# Patient Record
Sex: Male | Born: 1965
Health system: Southern US, Community
[De-identification: ages and names within clinical notes are randomized; demographics above are authoritative.]

## PROBLEM LIST (undated history)

## (undated) DIAGNOSIS — E781 Pure hyperglyceridemia: Secondary | ICD-10-CM

## (undated) DIAGNOSIS — E119 Type 2 diabetes mellitus without complications: Secondary | ICD-10-CM

## (undated) DIAGNOSIS — I10 Essential (primary) hypertension: Secondary | ICD-10-CM

## (undated) HISTORY — PX: NO PAST SURGERIES: SHX2092

## (undated) HISTORY — DX: Pure hyperglyceridemia: E78.1

---

## 2013-01-27 ENCOUNTER — Telehealth: Payer: Self-pay | Admitting: Family Medicine

## 2013-01-27 DIAGNOSIS — E782 Mixed hyperlipidemia: Secondary | ICD-10-CM

## 2013-01-27 DIAGNOSIS — Z125 Encounter for screening for malignant neoplasm of prostate: Secondary | ICD-10-CM

## 2013-01-27 DIAGNOSIS — Z79899 Other long term (current) drug therapy: Secondary | ICD-10-CM

## 2013-01-27 NOTE — Telephone Encounter (Signed)
Blood work papers printed and left up front for patient per doctors orders. Patient notified.

## 2013-01-27 NOTE — Telephone Encounter (Signed)
Lip, liv, met-7 and psa

## 2013-01-27 NOTE — Telephone Encounter (Signed)
Pt request BW papers please to be done this week on 01/29/13

## 2013-01-30 ENCOUNTER — Encounter: Payer: Self-pay | Admitting: *Deleted

## 2013-02-03 ENCOUNTER — Other Ambulatory Visit: Payer: Self-pay | Admitting: Family Medicine

## 2013-02-03 LAB — HEPATIC FUNCTION PANEL
ALT: 38 U/L (ref 0–53)
Bilirubin, Direct: 0.1 mg/dL (ref 0.0–0.3)
Indirect Bilirubin: 0.4 mg/dL (ref 0.0–0.9)
Total Protein: 7.8 g/dL (ref 6.0–8.3)

## 2013-02-03 LAB — BASIC METABOLIC PANEL
BUN: 12 mg/dL (ref 6–23)
Chloride: 103 mEq/L (ref 96–112)
Glucose, Bld: 247 mg/dL — ABNORMAL HIGH (ref 70–99)
Potassium: 4.7 mEq/L (ref 3.5–5.3)

## 2013-02-03 LAB — LIPID PANEL
LDL Cholesterol: 61 mg/dL (ref 0–99)
VLDL: 71 mg/dL — ABNORMAL HIGH (ref 0–40)

## 2013-02-03 LAB — PSA: PSA: 0.59 ng/mL (ref <=4.00)

## 2013-02-07 ENCOUNTER — Encounter: Payer: Self-pay | Admitting: Family Medicine

## 2013-02-07 ENCOUNTER — Ambulatory Visit (INDEPENDENT_AMBULATORY_CARE_PROVIDER_SITE_OTHER): Payer: BC Managed Care – PPO | Admitting: Family Medicine

## 2013-02-07 VITALS — BP 160/108 | HR 80 | Wt 217.0 lb

## 2013-02-07 DIAGNOSIS — I10 Essential (primary) hypertension: Secondary | ICD-10-CM

## 2013-02-07 DIAGNOSIS — E119 Type 2 diabetes mellitus without complications: Secondary | ICD-10-CM

## 2013-02-07 DIAGNOSIS — E785 Hyperlipidemia, unspecified: Secondary | ICD-10-CM

## 2013-02-07 LAB — POCT GLYCOSYLATED HEMOGLOBIN (HGB A1C): Hemoglobin A1C: 11.1

## 2013-02-07 MED ORDER — METFORMIN HCL 500 MG PO TABS: ORAL_TABLET | ORAL | Status: DC

## 2013-02-07 MED ORDER — FENOFIBRATE 160 MG PO TABS: 160.0000 mg | ORAL_TABLET | Freq: Every day | ORAL | Status: DC

## 2013-02-07 MED ORDER — GLYBURIDE 5 MG PO TABS: ORAL_TABLET | ORAL | Status: DC

## 2013-02-07 MED ORDER — ATORVASTATIN CALCIUM 10 MG PO TABS: 10.0000 mg | ORAL_TABLET | Freq: Every day | ORAL | Status: DC

## 2013-02-07 NOTE — Patient Instructions (Signed)
Increase glyburide to two tabs twice per day. Check sugar once each morning, record, and bring results next visit.

## 2013-02-07 NOTE — Progress Notes (Signed)
  Subjective:    Patient ID: Corey [name], male    DOB: 08-06-1966, 47 y.o.   MRN: [id number]  Diabetes He has type 2 diabetes mellitus. His disease course has been worsening. Symptoms are worsening. Current diabetic treatment includes diet. His weight is increasing steadily. He is following a diabetic, generally unhealthy, low salt and low fat/cholesterol diet. Meal planning includes avoidance of concentrated sweets. He has not had a previous visit with a dietician. He rarely participates in exercise. His breakfast blood glucose range is generally 180-200 mg/dl. An ACE inhibitor/angiotensin II receptor blocker is not being taken.   Stopped [place] six wks ago Results for orders placed in visit on 02/07/13  POCT GLYCOSYLATED HEMOGLOBIN (HGB A1C)      Result Value Range   Hemoglobin A1C 11.1     As far as his hyperlipidemia, patient states he does not always take his meds. He watches his diet only so-so. Not exercising at all.  With hypertension, patient often has excessive salt. In addition, not exercising. In addition, not always taking his medicine.  Review of Systems  All other systems reviewed and are negative.       Objective:   Physical Exam  Alert no acute distress. HEENT normal. Vitals reviewed. Blood pressure improved on repeat 146/92. Alert. Lungs clear. Heart regular in rhythm. Feet pulses good. Sensation good. No edema.      Assessment & Plan:  Impression #1 type 2 diabetes. Control very poor. 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 Did not want to take Januvia or so stopped Complicated by a #2. #2 profound noncompliance. #3 hyperlipidemia controlled suboptimum-discussed. Complicated by #2. #4 hypertension controlled suboptimum. Discussed. Complicated by #2. Plan patient simply asked to take his medicine. Stopped Januvia. Increase glyburide to 2 twice a day. Rationale discussed. Recheck recheck in 3 months. Diet exercise discussed. Easily 25 minutes spent  most in discussion. WSL

## 2013-02-09 DIAGNOSIS — E782 Mixed hyperlipidemia: Secondary | ICD-10-CM | POA: Insufficient documentation

## 2013-02-09 DIAGNOSIS — E785 Hyperlipidemia, unspecified: Secondary | ICD-10-CM | POA: Insufficient documentation

## 2013-02-09 DIAGNOSIS — I10 Essential (primary) hypertension: Secondary | ICD-10-CM | POA: Insufficient documentation

## 2013-02-09 DIAGNOSIS — E119 Type 2 diabetes mellitus without complications: Secondary | ICD-10-CM | POA: Insufficient documentation

## 2013-04-22 ENCOUNTER — Telehealth: Payer: Self-pay | Admitting: Family Medicine

## 2013-04-22 NOTE — Telephone Encounter (Signed)
Pt needs Rx sent to [facility] for test strips.  He is currently using One Touch Ultra Mini Please call pt when done.

## 2013-04-22 NOTE — Telephone Encounter (Signed)
Prescription faxed to [facility] for test strips.

## 2013-05-16 ENCOUNTER — Telehealth: Payer: Self-pay | Admitting: Family Medicine

## 2013-05-16 DIAGNOSIS — E785 Hyperlipidemia, unspecified: Secondary | ICD-10-CM

## 2013-05-16 DIAGNOSIS — Z125 Encounter for screening for malignant neoplasm of prostate: Secondary | ICD-10-CM

## 2013-05-16 DIAGNOSIS — Z79899 Other long term (current) drug therapy: Secondary | ICD-10-CM

## 2013-05-16 NOTE — Telephone Encounter (Signed)
Pt would like BW papers for appt

## 2013-05-17 ENCOUNTER — Other Ambulatory Visit: Payer: Self-pay | Admitting: Family Medicine

## 2013-05-18 LAB — HEPATIC FUNCTION PANEL
ALT: 32 U/L (ref 0–53)
AST: 21 U/L (ref 0–37)
Albumin: 4.5 g/dL (ref 3.5–5.2)
Alkaline Phosphatase: 29 U/L — ABNORMAL LOW (ref 39–117)
Total Bilirubin: 0.5 mg/dL (ref 0.3–1.2)
Total Protein: 7.9 g/dL (ref 6.0–8.3)

## 2013-05-18 LAB — BASIC METABOLIC PANEL
CO2: 26 mEq/L (ref 19–32)
Calcium: 10 mg/dL (ref 8.4–10.5)
Creat: 1.08 mg/dL (ref 0.50–1.35)
Sodium: 138 mEq/L (ref 135–145)

## 2013-05-18 LAB — LIPID PANEL
Cholesterol: 115 mg/dL (ref 0–200)
HDL: 17 mg/dL — ABNORMAL LOW (ref >39)
Total CHOL/HDL Ratio: 6.8 Ratio
Triglycerides: 392 mg/dL — ABNORMAL HIGH (ref <150)

## 2013-05-18 LAB — PSA: PSA: 0.49 ng/mL (ref <=4.00)

## 2013-05-22 ENCOUNTER — Other Ambulatory Visit: Payer: Self-pay | Admitting: *Deleted

## 2013-05-22 NOTE — Telephone Encounter (Signed)
Lip liv met7 psa. We'll do A1C here since visit tomorrow. Let him know we understand if he can't do these before visit

## 2013-05-22 NOTE — Telephone Encounter (Signed)
bw paper ready but pt states he did bw on sat. Papers were given on 05/16/13 also.

## 2013-05-22 NOTE — Addendum Note (Signed)
Addended by: Metro Kung on: 05/22/2013 09:08 AM   Modules accepted: Orders

## 2013-05-23 ENCOUNTER — Telehealth: Payer: Self-pay | Admitting: Family Medicine

## 2013-05-23 ENCOUNTER — Ambulatory Visit (INDEPENDENT_AMBULATORY_CARE_PROVIDER_SITE_OTHER): Payer: BC Managed Care – PPO | Admitting: Family Medicine

## 2013-05-23 ENCOUNTER — Encounter: Payer: Self-pay | Admitting: Family Medicine

## 2013-05-23 VITALS — BP 138/98 | HR 80 | Ht 67.0 in | Wt 216.0 lb

## 2013-05-23 DIAGNOSIS — E119 Type 2 diabetes mellitus without complications: Secondary | ICD-10-CM

## 2013-05-23 LAB — GLUCOSE, POCT (MANUAL RESULT ENTRY): POC Glucose: 251 mg/dl — AB (ref 70–99)

## 2013-05-23 LAB — POCT GLYCOSYLATED HEMOGLOBIN (HGB A1C): Hemoglobin A1C: >13

## 2013-05-23 NOTE — Telephone Encounter (Signed)
Notified. 

## 2013-05-23 NOTE — Telephone Encounter (Signed)
Yes, i told pt he'd be hearing from referral person

## 2013-05-23 NOTE — Telephone Encounter (Signed)
stve to see

## 2013-05-23 NOTE — Progress Notes (Signed)
  Subjective:    Patient ID: Corey [name], male    DOB: 14-Jul-1966, 47 y.o.   MRN: [id number]  Diabetes He presents for his follow-up diabetic visit. He has type 2 diabetes mellitus. There are no hypoglycemic associated symptoms. Pertinent negatives for diabetes include no chest pain and no fatigue. Symptoms are worsening. When asked about current treatments, none were reported.    Results for orders placed in visit on 05/23/13  POCT GLYCOSYLATED HEMOGLOBIN (HGB A1C)      Result Value Range   Hemoglobin A1C >13    GLUCOSE, POCT (MANUAL RESULT ENTRY)      Result Value Range   POC Glucose 251 (*) 70 - 99 mg/dl   watch diet best he can. Though he admits to poor dietary compliance and working.  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 His sugars continue to worsen. He is not exercising.   Review of Systems  Constitutional: Negative for fatigue.  Cardiovascular: Negative for chest pain.   Otherwise negative    Objective:   Physical Exam  Alert no acute distress. HEENT normal. Lungs clear. Heart regular rate and rhythm. Ankles without edema. Sensation intact pulses good      Assessment & Plan:  Impression #1 type 2 diabetes control is extremely poor. #2 hypertension decent control. #3 hyperlipidemia discussed. Plan diet exercise discussed. Blood work discussed. Consultation with diabetes specialist rationale discussed. WSL

## 2013-05-23 NOTE — Telephone Encounter (Signed)
Patient said you wanted him to see Dr Fransico Him and is wondering if he will need a referral?

## 2013-09-04 ENCOUNTER — Other Ambulatory Visit: Payer: Self-pay | Admitting: Family Medicine

## 2013-09-28 ENCOUNTER — Other Ambulatory Visit: Payer: Self-pay | Admitting: Family Medicine

## 2013-10-12 ENCOUNTER — Other Ambulatory Visit: Payer: Self-pay | Admitting: Family Medicine

## 2013-10-29 ENCOUNTER — Other Ambulatory Visit: Payer: Self-pay | Admitting: Family Medicine

## 2013-11-03 ENCOUNTER — Telehealth: Payer: Self-pay | Admitting: Family Medicine

## 2013-11-03 DIAGNOSIS — E119 Type 2 diabetes mellitus without complications: Secondary | ICD-10-CM

## 2013-11-03 DIAGNOSIS — E782 Mixed hyperlipidemia: Secondary | ICD-10-CM

## 2013-11-03 DIAGNOSIS — Z79899 Other long term (current) drug therapy: Secondary | ICD-10-CM

## 2013-11-03 NOTE — Telephone Encounter (Signed)
Patient needs BW ordered

## 2013-11-07 ENCOUNTER — Other Ambulatory Visit: Payer: Self-pay | Admitting: Family Medicine

## 2013-11-11 NOTE — Telephone Encounter (Signed)
Lip liv A1c plus ov

## 2013-11-11 NOTE — Telephone Encounter (Signed)
Blood work orders placed in Epic. Patient notified. 

## 2013-11-21 LAB — HEMOGLOBIN A1C
Hgb A1c MFr Bld: 8.6 % — ABNORMAL HIGH (ref <5.7)
MEAN PLASMA GLUCOSE: 200 mg/dL — AB (ref <117)

## 2013-11-21 LAB — LIPID PANEL
CHOLESTEROL: 125 mg/dL (ref 0–200)
HDL: 23 mg/dL — ABNORMAL LOW (ref >39)
LDL Cholesterol: 44 mg/dL (ref 0–99)
TRIGLYCERIDES: 292 mg/dL — AB (ref <150)
Total CHOL/HDL Ratio: 5.4 Ratio
VLDL: 58 mg/dL — ABNORMAL HIGH (ref 0–40)

## 2013-11-21 LAB — HEPATIC FUNCTION PANEL
ALT: 45 U/L (ref 0–53)
AST: 26 U/L (ref 0–37)
Albumin: 4.4 g/dL (ref 3.5–5.2)
Alkaline Phosphatase: 28 U/L — ABNORMAL LOW (ref 39–117)
BILIRUBIN INDIRECT: 0.4 mg/dL (ref 0.2–1.2)
Bilirubin, Direct: 0.1 mg/dL (ref 0.0–0.3)
TOTAL PROTEIN: 7.3 g/dL (ref 6.0–8.3)
Total Bilirubin: 0.5 mg/dL (ref 0.2–1.2)

## 2013-11-28 ENCOUNTER — Ambulatory Visit (INDEPENDENT_AMBULATORY_CARE_PROVIDER_SITE_OTHER): Payer: BC Managed Care – PPO | Admitting: Family Medicine

## 2013-11-28 ENCOUNTER — Encounter: Payer: Self-pay | Admitting: Family Medicine

## 2013-11-28 VITALS — BP 150/90 | Ht 67.25 in | Wt 217.1 lb

## 2013-11-28 DIAGNOSIS — E119 Type 2 diabetes mellitus without complications: Secondary | ICD-10-CM

## 2013-11-28 DIAGNOSIS — E785 Hyperlipidemia, unspecified: Secondary | ICD-10-CM

## 2013-11-28 DIAGNOSIS — I10 Essential (primary) hypertension: Secondary | ICD-10-CM

## 2013-11-28 DIAGNOSIS — Z0000 Encounter for general adult medical examination without abnormal findings: Secondary | ICD-10-CM

## 2013-11-28 MED ORDER — METFORMIN HCL 500 MG PO TABS: ORAL_TABLET | ORAL | Status: DC

## 2013-11-28 MED ORDER — FENOFIBRATE 160 MG PO TABS: ORAL_TABLET | ORAL | Status: DC

## 2013-11-28 MED ORDER — SITAGLIPTIN PHOSPHATE 100 MG PO TABS: 100.0000 mg | ORAL_TABLET | Freq: Every day | ORAL | Status: DC

## 2013-11-28 MED ORDER — ATORVASTATIN CALCIUM 10 MG PO TABS: ORAL_TABLET | ORAL | Status: DC

## 2013-11-28 MED ORDER — GLYBURIDE 5 MG PO TABS: ORAL_TABLET | ORAL | Status: DC

## 2013-11-28 NOTE — Progress Notes (Signed)
Subjective:    Patient ID: Corey [name], male    DOB: 09-05-1966, 48 y.o.   MRN: [id number]  HPI Patient states he is here today for his annual wellness exam. States he has no concerns at this time. He is doing very well.  Results for orders placed in visit on 11/03/13  LIPID PANEL      Result Value Range   Cholesterol 125  0 - 200 mg/dL   Triglycerides 119 (*) <150 mg/dL   HDL 23 (*) >14 mg/dL   Total CHOL/HDL Ratio 5.4     VLDL 58 (*) 0 - 40 mg/dL   LDL Cholesterol 44  0 - 99 mg/dL  HEPATIC FUNCTION PANEL      Result Value Range   Total Bilirubin 0.5  0.2 - 1.2 mg/dL   Bilirubin, Direct 0.1  0.0 - 0.3 mg/dL   Indirect Bilirubin 0.4  0.2 - 1.2 mg/dL   Alkaline Phosphatase 28 (*) 39 - 117 U/L   AST 26  0 - 37 U/L   ALT 45  0 - 53 U/L   Total Protein 7.3  6.0 - 8.3 g/dL   Albumin 4.4  3.5 - 5.2 g/dL  HEMOGLOBIN N8G      Result Value Range   Hemoglobin A1C 8.6 (*) <5.7 %   Mean Plasma Glucose 200 (*) <117 mg/dL   BP usually doesn't check  Exercising not so well,  On januvia, got a discount, able to get for inexpensive. Patient states he did not go to a specialist as scheduled. It would have cost him too much. Instead he has been working harder on diet.  Has not checked sugars lately but noted the numbers were improving the last time he evaluated.  Due to turn 48 this spring.   Review of Systems  Constitutional: Negative for fever, activity change and appetite change.  HENT: Negative for congestion and rhinorrhea.   Eyes: Negative for discharge.  Respiratory: Negative for cough and wheezing.   Cardiovascular: Negative for chest pain.  Gastrointestinal: Negative for vomiting, abdominal pain and blood in stool.  Genitourinary: Negative for frequency and difficulty urinating.  Musculoskeletal: Negative for neck pain.  Skin: Negative for rash.  Allergic/Immunologic: Negative for environmental allergies and food allergies.  Neurological: Negative for weakness and headaches.   Psychiatric/Behavioral: Negative for agitation.       Objective:   Physical Exam  Constitutional: He appears well-developed and well-nourished.  HENT:  Head: Normocephalic and atraumatic.  Right Ear: External ear normal.  Left Ear: External ear normal.  Nose: Nose normal.  Mouth/Throat: Oropharynx is clear and moist.  Eyes: EOM are normal. Pupils are equal, round, and reactive to light.  Neck: Normal range of motion. Neck supple. No thyromegaly present.  Cardiovascular: Normal rate, regular rhythm and normal heart sounds.   No murmur heard. Pulmonary/Chest: Effort normal and breath sounds normal. No respiratory distress. He has no wheezes.  Abdominal: Soft. Bowel sounds are normal. He exhibits no distension and no mass. There is no tenderness.  Genitourinary: Prostate normal and penis normal.  Musculoskeletal: Normal range of motion. He exhibits no edema.  Lymphadenopathy:    He has no cervical adenopathy.  Neurological: He is alert. He exhibits normal muscle tone.  Skin: Skin is warm and dry. No erythema.  Psychiatric: He has a normal mood and affect. His behavior is normal. Judgment normal.          Assessment & Plan:  Impression 1 wellness exam. #2 type 2  diabetes with noncompliance both with past medications diet exercise and even consultation with specialist. Long discussion held. For now we will maintain management for the patient. Plan colonoscopy sheet given. Diet exercise discussed. Maintain same medications. Recheck in several months. WSL

## 2013-11-30 ENCOUNTER — Other Ambulatory Visit: Payer: Self-pay | Admitting: Family Medicine

## 2013-12-31 ENCOUNTER — Encounter: Payer: Self-pay | Admitting: Family Medicine

## 2013-12-31 ENCOUNTER — Ambulatory Visit (INDEPENDENT_AMBULATORY_CARE_PROVIDER_SITE_OTHER): Payer: BC Managed Care – PPO | Admitting: Family Medicine

## 2013-12-31 VITALS — BP 136/88 | Ht 67.0 in | Wt 217.2 lb

## 2013-12-31 DIAGNOSIS — R079 Chest pain, unspecified: Secondary | ICD-10-CM

## 2013-12-31 MED ORDER — ETODOLAC 400 MG PO TABS: 400.0000 mg | ORAL_TABLET | Freq: Two times a day (BID) | ORAL | Status: DC

## 2013-12-31 NOTE — Progress Notes (Signed)
   Subjective:    Patient ID: Corey [name], male    DOB: May 23, 1966, 48 y.o.   MRN: [id number]  HPI Patient arrives with chest pain with movement and stretching for a few weeks. Having chest discomfort with certain angle of the arm  Going on for several weeks  Pain transient last a few monents  No nausea nos sweaty no sob  No noct painno pain wwith exertion  No other symptoms.  [facility]   fa cd at age 48 Review of Systems No nausea no diaphoresis no back pain no abdominal pain no change in bowel habits stool ROS otherwise    Objective:   Physical Exam  Alert no apparent distress. Lungs clear. Heart regular in rhythm. H&T normal. Abdomen benign. Chest wall nontender to palpation.  EKG normal sinus rhythm. No significant ST-T changes.      Assessment & Plan:  Impression 1 chest pain highly likely musculoskeletal discussed at great length. Hold off from cardiology referral the Voltaren twice a day with food when necessary.

## 2014-01-26 ENCOUNTER — Encounter: Payer: Self-pay | Admitting: Family Medicine

## 2014-03-02 ENCOUNTER — Telehealth: Payer: Self-pay | Admitting: Family Medicine

## 2014-03-02 DIAGNOSIS — Z13220 Encounter for screening for lipoid disorders: Secondary | ICD-10-CM

## 2014-03-02 DIAGNOSIS — E119 Type 2 diabetes mellitus without complications: Secondary | ICD-10-CM

## 2014-03-02 DIAGNOSIS — Z125 Encounter for screening for malignant neoplasm of prostate: Secondary | ICD-10-CM

## 2014-03-02 DIAGNOSIS — Z79899 Other long term (current) drug therapy: Secondary | ICD-10-CM

## 2014-03-02 NOTE — Telephone Encounter (Signed)
Blood work orders placed in Epic. Patient notified. 

## 2014-03-02 NOTE — Telephone Encounter (Signed)
Patient had Lipid, Liver and Hgb A1c on 11/21/13

## 2014-03-02 NOTE — Telephone Encounter (Signed)
Also psa m7

## 2014-03-02 NOTE — Telephone Encounter (Signed)
Lip liv A1C 

## 2014-03-02 NOTE — Addendum Note (Signed)
Addended by: Margaretha SheffieldBROWN, AUTUMN S on: 03/02/2014 04:42 PM   Modules accepted: Orders

## 2014-03-02 NOTE — Telephone Encounter (Signed)
Patient has diabetic check on 5/15 and wanting to get his labs done before he come in. Please notify when papers are sent over to [facility].

## 2014-03-03 LAB — BASIC METABOLIC PANEL
BUN: 16 mg/dL (ref 6–23)
CALCIUM: 9.9 mg/dL (ref 8.4–10.5)
CO2: 24 mEq/L (ref 19–32)
CREATININE: 1.1 mg/dL (ref 0.50–1.35)
Chloride: 103 mEq/L (ref 96–112)
Glucose, Bld: 217 mg/dL — ABNORMAL HIGH (ref 70–99)
Potassium: 4.6 mEq/L (ref 3.5–5.3)
SODIUM: 137 meq/L (ref 135–145)

## 2014-03-03 LAB — HEPATIC FUNCTION PANEL
ALT: 33 U/L (ref 0–53)
AST: 24 U/L (ref 0–37)
Albumin: 4.4 g/dL (ref 3.5–5.2)
Alkaline Phosphatase: 27 U/L — ABNORMAL LOW (ref 39–117)
BILIRUBIN TOTAL: 0.4 mg/dL (ref 0.2–1.2)
Bilirubin, Direct: 0.1 mg/dL (ref 0.0–0.3)
Indirect Bilirubin: 0.3 mg/dL (ref 0.2–1.2)
TOTAL PROTEIN: 7.7 g/dL (ref 6.0–8.3)

## 2014-03-03 LAB — LIPID PANEL
Cholesterol: 139 mg/dL (ref 0–200)
HDL: 17 mg/dL — ABNORMAL LOW (ref >39)
LDL CALC: 43 mg/dL (ref 0–99)
Total CHOL/HDL Ratio: 8.2 Ratio
Triglycerides: 395 mg/dL — ABNORMAL HIGH (ref <150)
VLDL: 79 mg/dL — ABNORMAL HIGH (ref 0–40)

## 2014-03-03 LAB — HEMOGLOBIN A1C
HEMOGLOBIN A1C: 8.8 % — AB (ref <5.7)
MEAN PLASMA GLUCOSE: 206 mg/dL — AB (ref <117)

## 2014-03-04 LAB — PSA: PSA: 0.58 ng/mL (ref <=4.00)

## 2014-03-06 ENCOUNTER — Ambulatory Visit (INDEPENDENT_AMBULATORY_CARE_PROVIDER_SITE_OTHER): Payer: BC Managed Care – PPO | Admitting: Family Medicine

## 2014-03-06 ENCOUNTER — Encounter: Payer: Self-pay | Admitting: Family Medicine

## 2014-03-06 VITALS — BP 136/85 | Ht 67.0 in | Wt 215.0 lb

## 2014-03-06 DIAGNOSIS — E785 Hyperlipidemia, unspecified: Secondary | ICD-10-CM

## 2014-03-06 DIAGNOSIS — I10 Essential (primary) hypertension: Secondary | ICD-10-CM

## 2014-03-06 DIAGNOSIS — E119 Type 2 diabetes mellitus without complications: Secondary | ICD-10-CM

## 2014-03-06 MED ORDER — FENOFIBRATE 160 MG PO TABS: ORAL_TABLET | ORAL | Status: DC

## 2014-03-06 MED ORDER — SITAGLIPTIN PHOSPHATE 100 MG PO TABS: 100.0000 mg | ORAL_TABLET | Freq: Every day | ORAL | Status: DC

## 2014-03-06 MED ORDER — ATORVASTATIN CALCIUM 10 MG PO TABS: ORAL_TABLET | ORAL | Status: DC

## 2014-03-06 MED ORDER — GLYBURIDE 5 MG PO TABS: ORAL_TABLET | ORAL | Status: DC

## 2014-03-06 MED ORDER — METFORMIN HCL 500 MG PO TABS: ORAL_TABLET | ORAL | Status: DC

## 2014-03-06 NOTE — Progress Notes (Signed)
   Subjective:    Patient ID: Corey [name], male    DOB: 27-Dec-1965, 48 y.o.   MRN: [id number]  Diabetes He presents for his follow-up diabetic visit. He has type 2 diabetes mellitus. He never participates in exercise. His breakfast blood glucose range is generally 140-180 mg/dl. He does not see a podiatrist.Eye exam is not current.  pt had blood work done a1c 8.8. No sig low sugar spells.  Compliant with liped med Pt states no concerns today  Results for orders placed in visit on 03/02/14  LIPID PANEL      Result Value Ref Range   Cholesterol 139  0 - 200 mg/dL   Triglycerides 409395 (*) <150 mg/dL   HDL 17 (*) >81>39 mg/dL   Total CHOL/HDL Ratio 8.2     VLDL 79 (*) 0 - 40 mg/dL   LDL Cholesterol 43  0 - 99 mg/dL  HEPATIC FUNCTION PANEL      Result Value Ref Range   Total Bilirubin 0.4  0.2 - 1.2 mg/dL   Bilirubin, Direct 0.1  0.0 - 0.3 mg/dL   Indirect Bilirubin 0.3  0.2 - 1.2 mg/dL   Alkaline Phosphatase 27 (*) 39 - 117 U/L   AST 24  0 - 37 U/L   ALT 33  0 - 53 U/L   Total Protein 7.7  6.0 - 8.3 g/dL   Albumin 4.4  3.5 - 5.2 g/dL  HEMOGLOBIN [id number]      Result Value Ref Range   Hemoglobin A1C 8.8 (*) <5.7 %   Mean Plasma Glucose 206 (*) <117 mg/dL  PSA      Result Value Ref Range   PSA 0.58  <=4.00 ng/mL  BASIC METABOLIC PANEL      Result Value Ref Range   Sodium 137  135 - 145 mEq/L   Potassium 4.6  3.5 - 5.3 mEq/L   Chloride 103  96 - 112 mEq/L   CO2 24  19 - 32 mEq/L   Glucose, Bld 217 (*) 70 - 99 mg/dL   BUN 16  6 - 23 mg/dL   Creat 4.781.10  2.950.50 - 6.211.35 mg/dL   Calcium 9.9  8.4 - 30.810.5 mg/dL   Exercise poor, not good per pt, just not getting around to it   Review of Systems No headache no chest pain no back pain no abdominal pain no change about habits no blood in stool no rash ROS otherwise negative    Objective:   Physical Exam  Alert mild malaise. Blood pressure eczema repeat. HEENT normal. Lungs clear. Heart rare rhythm. Ankles edema. Feet pulses good sensation  intact. No deformity.      Assessment & Plan:  Impression 1 type 2 diabetes still suboptimal in discussed at length. Patient admits to dietary and exercise noncompliance. #2 hypertension controlled by diet alone good control discomfort #3 hyperlipidemia cholesterol side is in good control. He triglycerides side not. Discussed at length. Plan diet exercise discussed. Recheck in 4 months. Numbers need to be improved. Medications refilled. WSL

## 2014-03-26 ENCOUNTER — Other Ambulatory Visit: Payer: Self-pay | Admitting: Family Medicine

## 2014-05-19 ENCOUNTER — Telehealth: Payer: Self-pay | Admitting: Family Medicine

## 2014-05-19 DIAGNOSIS — E781 Pure hyperglyceridemia: Secondary | ICD-10-CM

## 2014-05-19 DIAGNOSIS — Z79899 Other long term (current) drug therapy: Secondary | ICD-10-CM

## 2014-05-19 DIAGNOSIS — E119 Type 2 diabetes mellitus without complications: Secondary | ICD-10-CM

## 2014-05-19 NOTE — Telephone Encounter (Signed)
Lip liv A1c 

## 2014-05-19 NOTE — Telephone Encounter (Signed)
Patient notified and verbalized understanding. 

## 2014-05-19 NOTE — Telephone Encounter (Signed)
Pts needs bw orders for appt on 7/31  Last labs 03/02/14   Lipid, Hep Func, A1C, PSA, BMP

## 2014-05-22 ENCOUNTER — Encounter: Payer: Self-pay | Admitting: Family Medicine

## 2014-05-22 ENCOUNTER — Ambulatory Visit (INDEPENDENT_AMBULATORY_CARE_PROVIDER_SITE_OTHER): Payer: BC Managed Care – PPO | Admitting: Family Medicine

## 2014-05-22 VITALS — BP 142/82 | Ht 67.0 in | Wt 209.0 lb

## 2014-05-22 DIAGNOSIS — E785 Hyperlipidemia, unspecified: Secondary | ICD-10-CM

## 2014-05-22 DIAGNOSIS — E119 Type 2 diabetes mellitus without complications: Secondary | ICD-10-CM

## 2014-05-22 DIAGNOSIS — I10 Essential (primary) hypertension: Secondary | ICD-10-CM

## 2014-05-22 LAB — HEPATIC FUNCTION PANEL
ALK PHOS: 24 U/L — AB (ref 39–117)
ALT: 32 U/L (ref 0–53)
AST: 23 U/L (ref 0–37)
Albumin: 4.5 g/dL (ref 3.5–5.2)
BILIRUBIN TOTAL: 0.6 mg/dL (ref 0.2–1.2)
Bilirubin, Direct: 0.1 mg/dL (ref 0.0–0.3)
Indirect Bilirubin: 0.5 mg/dL (ref 0.2–1.2)
Total Protein: 7.5 g/dL (ref 6.0–8.3)

## 2014-05-22 LAB — LIPID PANEL
CHOLESTEROL: 130 mg/dL (ref 0–200)
HDL: 17 mg/dL — AB (ref >39)
LDL Cholesterol: 57 mg/dL (ref 0–99)
Total CHOL/HDL Ratio: 7.6 Ratio
Triglycerides: 282 mg/dL — ABNORMAL HIGH (ref <150)
VLDL: 56 mg/dL — ABNORMAL HIGH (ref 0–40)

## 2014-05-22 LAB — HEMOGLOBIN A1C
HEMOGLOBIN A1C: 8 % — AB (ref <5.7)
Mean Plasma Glucose: 183 mg/dL — ABNORMAL HIGH (ref <117)

## 2014-05-22 NOTE — Progress Notes (Signed)
   Subjective:    Patient ID: Corey [name], male    DOB: Oct 30, 1965, 48 y.o.   MRN: [id number]  Diabetes He presents for his follow-up diabetic visit. He has type 2 diabetes mellitus. Risk factors for coronary artery disease include diabetes mellitus, dyslipidemia and hypertension. Current diabetic treatment includes oral agent (triple therapy). He is compliant with treatment all of the time. He is following a diabetic diet. He has not had a previous visit with a dietician. He participates in exercise intermittently. He does not see a podiatrist.Eye exam is not current.   Patient had blood work including A1c drawn at lab this am.  Most mornings 135 to 150  Exercise walking more active   Two to three per wk  n eye doc for awhile, Patient has known history of elevated blood pressure. Currently control with diet alone. Trying to watch salt intake. Exercising some not as much as he hoped.  Compliant with her lipid medication. No obvious side effects. Has type of his diet. Wonders about his numbers understandably.  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 Discussed no headache no chest pain no back pain abdominal pain no change in bowel habits no blood in stool ROS otherwise negative  Review of Systems See above    Objective:   Physical Exam  Alert no apparent distress HEENT normal. Blood pressure good lungs clear. Heart rare in rhythm. Ankles without edema.      Assessment & Plan:  Impression 1 type 2 diabetes control uncertain. #2 hyperlipidemia control uncertain. 3 hypertension decent control with numbers off meds. Plan diet exercise discussed. Maintain same meds. Recheck as scheduled. Further recommendations based results. WSL

## 2014-05-24 ENCOUNTER — Other Ambulatory Visit: Payer: Self-pay | Admitting: Family Medicine

## 2014-08-17 ENCOUNTER — Ambulatory Visit: Payer: BC Managed Care – PPO | Admitting: Family Medicine

## 2014-09-09 ENCOUNTER — Ambulatory Visit: Payer: BC Managed Care – PPO | Admitting: Family Medicine

## 2014-09-15 ENCOUNTER — Telehealth: Payer: Self-pay

## 2014-09-15 NOTE — Telephone Encounter (Signed)
Patient's wife, Babette Relicammy, called to set up tcs for her husband, Please call [phone]

## 2014-09-21 ENCOUNTER — Other Ambulatory Visit: Payer: Self-pay | Admitting: Family Medicine

## 2014-09-22 ENCOUNTER — Other Ambulatory Visit: Payer: Self-pay

## 2014-09-22 DIAGNOSIS — Z1211 Encounter for screening for malignant neoplasm of colon: Secondary | ICD-10-CM

## 2014-09-22 NOTE — Telephone Encounter (Signed)
Gastroenterology Pre-Procedure Review  Request Date: 09/22/2014 Requesting Physician: Dr. Lubertha SouthSteve Luking  PATIENT REVIEW QUESTIONS: The patient responded to the following health history questions as indicated:    1. Diabetes Melitis: YES 2. Joint replacements in the past 12 months: no 3. Major health problems in the past 3 months: no 4. Has an artificial valve or MVP: no 5. Has a defibrillator: no 6. Has been advised in past to take antibiotics in advance of a procedure like teeth cleaning: no    MEDICATIONS & ALLERGIES:    Patient reports the following regarding taking any blood thinners:   Plavix? no Aspirin? no Coumadin? no  Patient confirms/reports the following medications:  Current Outpatient Prescriptions  Medication Sig Dispense Refill  . atorvastatin (LIPITOR) 10 MG tablet TAKE 1 TABLET (10 MG TOTAL) BY MOUTH DAILY. 30 tablet 0  . fenofibrate 160 MG tablet TAKE 1 TABLET (160 MG TOTAL) BY MOUTH DAILY. 30 tablet 0  . glyBURIDE (DIABETA) 5 MG tablet TAKE 2 TABLETS BY MOUTH TWICE A DAY 120 tablet 0  . JANUVIA 100 MG tablet TAKE 1 TABLET BY MOUTH DAILY 30 tablet 5  . metFORMIN (GLUCOPHAGE) 500 MG tablet TAKE 2 TABLETS BY MOUTH EVERY MORNING, 1 TABLET AT NOON, AND 2 TABLETS IN THE EVENING 150 tablet 0  . ONE TOUCH ULTRA TEST test strip USE AS DIRECTED 100 each 5  . sitaGLIPtin (JANUVIA) 100 MG tablet Take 1 tablet (100 mg total) by mouth daily. 30 tablet 5   No current facility-administered medications for this visit.    Patient confirms/reports the following allergies:  No Known Allergies  No orders of the defined types were placed in this encounter.    AUTHORIZATION INFORMATION Primary Insurance:   ID #:   Group #:  Pre-Cert / Auth required: Pre-Cert / Auth #:  Secondary Insurance:   ID #:   Group #:  Pre-Cert / Auth require:  Pre-Cert / Auth #:   SCHEDULE INFORMATION: Procedure has been scheduled as follows:  Date: 10/14/2014                   Time: 8:30 AM   [facility]  This Gastroenterology Pre-Precedure Review Form is being routed to the following provider(s): R. Roetta SessionsMichael Rourk, MD

## 2014-09-23 NOTE — Telephone Encounter (Signed)
Appropriate. Night before the procedure: take half dose of Metformin and Glyburide (and Januvia if she takes it at night) Day of: hold all DM meds.

## 2014-09-24 MED ORDER — PEG-KCL-NACL-NASULF-NA ASC-C 100 G PO SOLR: 1.0000 | ORAL | Status: DC

## 2014-09-24 NOTE — Addendum Note (Signed)
Addended by: Lavena BullionSTEWART, Takeyla Million H on: 09/24/2014 10:18 AM   Modules accepted: Orders

## 2014-09-24 NOTE — Telephone Encounter (Signed)
Rx mailed to the pharmacy and instructions mailed to pt.

## 2014-10-01 ENCOUNTER — Telehealth: Payer: Self-pay | Admitting: Family Medicine

## 2014-10-01 DIAGNOSIS — E781 Pure hyperglyceridemia: Secondary | ICD-10-CM

## 2014-10-01 DIAGNOSIS — R5383 Other fatigue: Secondary | ICD-10-CM

## 2014-10-01 DIAGNOSIS — E119 Type 2 diabetes mellitus without complications: Secondary | ICD-10-CM

## 2014-10-01 DIAGNOSIS — I10 Essential (primary) hypertension: Secondary | ICD-10-CM

## 2014-10-01 DIAGNOSIS — Z79899 Other long term (current) drug therapy: Secondary | ICD-10-CM

## 2014-10-01 NOTE — Telephone Encounter (Signed)
Needs bw orders please call when sent appt for 12/18  Last labs 7/31 Lipid, Hep Func, A1C

## 2014-10-01 NOTE — Telephone Encounter (Signed)
Lipid, liver, hemoglobin A1c, metabolic 7, TSH, urine micro-protein

## 2014-10-01 NOTE — Telephone Encounter (Signed)
Notified patient via VM stating blood work orders are in. 

## 2014-10-06 LAB — HEMOGLOBIN A1C
HEMOGLOBIN A1C: 8.8 % — AB (ref <5.7)
Mean Plasma Glucose: 206 mg/dL — ABNORMAL HIGH (ref <117)

## 2014-10-06 LAB — BASIC METABOLIC PANEL
BUN: 11 mg/dL (ref 6–23)
CALCIUM: 10.3 mg/dL (ref 8.4–10.5)
CO2: 27 mEq/L (ref 19–32)
CREATININE: 0.98 mg/dL (ref 0.50–1.35)
Chloride: 104 mEq/L (ref 96–112)
Glucose, Bld: 150 mg/dL — ABNORMAL HIGH (ref 70–99)
Potassium: 4.8 mEq/L (ref 3.5–5.3)
Sodium: 139 mEq/L (ref 135–145)

## 2014-10-06 LAB — HEPATIC FUNCTION PANEL
ALBUMIN: 4.6 g/dL (ref 3.5–5.2)
ALK PHOS: 26 U/L — AB (ref 39–117)
ALT: 37 U/L (ref 0–53)
AST: 29 U/L (ref 0–37)
BILIRUBIN INDIRECT: 0.3 mg/dL (ref 0.2–1.2)
Bilirubin, Direct: 0.1 mg/dL (ref 0.0–0.3)
TOTAL PROTEIN: 7.8 g/dL (ref 6.0–8.3)
Total Bilirubin: 0.4 mg/dL (ref 0.2–1.2)

## 2014-10-06 LAB — MICROALBUMIN, URINE: MICROALB UR: 10.6 mg/dL — AB (ref <2.0)

## 2014-10-06 LAB — LIPID PANEL
Cholesterol: 140 mg/dL (ref 0–200)
HDL: 24 mg/dL — AB (ref >39)
LDL CALC: 72 mg/dL (ref 0–99)
Total CHOL/HDL Ratio: 5.8 Ratio
Triglycerides: 222 mg/dL — ABNORMAL HIGH (ref <150)
VLDL: 44 mg/dL — AB (ref 0–40)

## 2014-10-06 LAB — TSH: TSH: 1.239 u[IU]/mL (ref 0.350–4.500)

## 2014-10-09 ENCOUNTER — Encounter: Payer: Self-pay | Admitting: Family Medicine

## 2014-10-09 ENCOUNTER — Ambulatory Visit (INDEPENDENT_AMBULATORY_CARE_PROVIDER_SITE_OTHER): Payer: BC Managed Care – PPO | Admitting: Family Medicine

## 2014-10-09 VITALS — BP 152/98 | Ht 67.0 in | Wt 212.0 lb

## 2014-10-09 DIAGNOSIS — E11319 Type 2 diabetes mellitus with unspecified diabetic retinopathy without macular edema: Secondary | ICD-10-CM

## 2014-10-09 DIAGNOSIS — I10 Essential (primary) hypertension: Secondary | ICD-10-CM

## 2014-10-09 MED ORDER — CANAGLIFLOZIN 300 MG PO TABS: 300.0000 mg | ORAL_TABLET | Freq: Every day | ORAL | Status: DC

## 2014-10-09 MED ORDER — CANAGLIFLOZIN 100 MG PO TABS: 100.0000 mg | ORAL_TABLET | Freq: Every day | ORAL | Status: DC

## 2014-10-09 MED ORDER — LISINOPRIL 10 MG PO TABS: 10.0000 mg | ORAL_TABLET | Freq: Every day | ORAL | Status: DC

## 2014-10-09 NOTE — Progress Notes (Signed)
   Subjective:    Patient ID: Corey [name], male    DOB: Mar 18, 1966, 48 y.o.   MRN: [id number]  Diabetes He presents for his follow-up diabetic visit. He has type 2 diabetes mellitus. He is compliant with treatment all of the time. He never participates in exercise. His breakfast blood glucose range is generally 140-180 mg/dl. He does not see a podiatrist.Eye exam is not current.   Bloodwork done on 12/14.  Results for orders placed or performed in visit on 10/01/14  Lipid panel  Result Value Ref Range   Cholesterol 140 0 - 200 mg/dL   Triglycerides 956222 (H) <150 mg/dL   HDL 24 (L) >38>39 mg/dL   Total CHOL/HDL Ratio 5.8 Ratio   VLDL 44 (H) 0 - 40 mg/dL   LDL Cholesterol 72 0 - 99 mg/dL  Hepatic function panel  Result Value Ref Range   Total Bilirubin 0.4 0.2 - 1.2 mg/dL   Bilirubin, Direct 0.1 0.0 - 0.3 mg/dL   Indirect Bilirubin 0.3 0.2 - 1.2 mg/dL   Alkaline Phosphatase 26 (L) 39 - 117 U/L   AST 29 0 - 37 U/L   ALT 37 0 - 53 U/L   Total Protein 7.8 6.0 - 8.3 g/dL   Albumin 4.6 3.5 - 5.2 g/dL  Hemoglobin V5IA1c  Result Value Ref Range   Hgb A1c MFr Bld 8.8 (H) <5.7 %   Mean Plasma Glucose 206 (H) <117 mg/dL  Basic metabolic panel  Result Value Ref Range   Sodium 139 135 - 145 mEq/L   Potassium 4.8 3.5 - 5.3 mEq/L   Chloride 104 96 - 112 mEq/L   CO2 27 19 - 32 mEq/L   Glucose, Bld 150 (H) 70 - 99 mg/dL   BUN 11 6 - 23 mg/dL   Creat 4.330.98 2.950.50 - 1.881.35 mg/dL   Calcium 41.610.3 8.4 - 60.610.5 mg/dL  TSH  Result Value Ref Range   TSH 1.239 0.350 - 4.500 uIU/mL  Microalbumin, urine  Result Value Ref Range   Microalb, Ur 10.6 (H) <2.0 mg/dL   T0ZA1C was 8.8. Declines flu vaccine.  No concerns.   Low sugar spells a couple times per wk,  Patient has been doing with elevated blood pressure off-and-on. Admits to dietary noncompliance. Strong family history of hypertension. Ongoing salt intake.  Review blood work returns. Microalbumin test came back positive significantly 10.6  Review of  Systems No headache no chest pain no back pain no abdominal pain no change in bowel habits no blood in stool    Objective:   Physical Exam  Alert no apparent distress vital stable HET normal. Lungs clear. Heart regular rhythm. Ankles without edema.      Assessment & Plan:  Impression hypertension time to press on with medications discussed #2 hyperlipidemia improved control #3 type 2 diabetes suboptimum control with noncompliance. 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 Add Prinivil. Rationale discussed follow-up in 3 months. WSL

## 2014-10-14 ENCOUNTER — Encounter (HOSPITAL_COMMUNITY): Payer: Self-pay | Admitting: *Deleted

## 2014-10-14 ENCOUNTER — Ambulatory Visit (HOSPITAL_COMMUNITY)
Admission: RE | Admit: 2014-10-14 | Discharge: 2014-10-14 | Disposition: A | Discharge status: Home / Self Care | Payer: BC Managed Care – PPO | Source: Ambulatory Visit | Attending: Internal Medicine | Admitting: Internal Medicine

## 2014-10-14 ENCOUNTER — Encounter (HOSPITAL_COMMUNITY)
Admission: RE | Disposition: A | Discharge status: Home / Self Care | Payer: Self-pay | Source: Ambulatory Visit | Attending: Internal Medicine

## 2014-10-14 DIAGNOSIS — E119 Type 2 diabetes mellitus without complications: Secondary | ICD-10-CM | POA: Insufficient documentation

## 2014-10-14 DIAGNOSIS — Z1211 Encounter for screening for malignant neoplasm of colon: Secondary | ICD-10-CM

## 2014-10-14 DIAGNOSIS — I10 Essential (primary) hypertension: Secondary | ICD-10-CM | POA: Insufficient documentation

## 2014-10-14 DIAGNOSIS — Z87891 Personal history of nicotine dependence: Secondary | ICD-10-CM | POA: Diagnosis not present

## 2014-10-14 HISTORY — PX: COLONOSCOPY: SHX5424

## 2014-10-14 LAB — GLUCOSE, CAPILLARY: Glucose-Capillary: 203 mg/dL — ABNORMAL HIGH (ref 70–99)

## 2014-10-14 SURGERY — COLONOSCOPY
Anesthesia: Moderate Sedation

## 2014-10-14 MED ORDER — MIDAZOLAM HCL 5 MG/5ML IJ SOLN
INTRAMUSCULAR | Status: AC
  Filled 2014-10-14: qty 10

## 2014-10-14 MED ORDER — MEPERIDINE HCL 100 MG/ML IJ SOLN
INTRAMUSCULAR | Status: DC | PRN
  Administered 2014-10-14: 50 mg via INTRAVENOUS
  Administered 2014-10-14: 25 mg via INTRAVENOUS

## 2014-10-14 MED ORDER — MIDAZOLAM HCL 5 MG/5ML IJ SOLN
INTRAMUSCULAR | Status: DC | PRN
  Administered 2014-10-14 (×2): 2 mg via INTRAVENOUS
  Administered 2014-10-14 (×2): 1 mg via INTRAVENOUS

## 2014-10-14 MED ORDER — MEPERIDINE HCL 100 MG/ML IJ SOLN
INTRAMUSCULAR | Status: DC
Start: 2014-10-14 — End: 2014-10-14
  Filled 2014-10-14: qty 2

## 2014-10-14 MED ORDER — ONDANSETRON HCL 4 MG/2ML IJ SOLN
INTRAMUSCULAR | Status: DC | PRN
  Administered 2014-10-14: 4 mg via INTRAVENOUS

## 2014-10-14 MED ORDER — STERILE WATER FOR IRRIGATION IR SOLN
Status: DC | PRN
  Administered 2014-10-14: 09:00:00

## 2014-10-14 MED ORDER — SODIUM CHLORIDE 0.9 % IV SOLN
INTRAVENOUS | Status: DC
  Administered 2014-10-14: 08:00:00 via INTRAVENOUS

## 2014-10-14 MED ORDER — ONDANSETRON HCL 4 MG/2ML IJ SOLN
INTRAMUSCULAR | Status: AC
  Filled 2014-10-14: qty 2

## 2014-10-14 NOTE — Op Note (Signed)
[facility] [address] [place], [id number]   COLONOSCOPY PROCEDURE REPORT  PATIENT: [name], Corey  MR#: [id number] BIRTHDATE: 1966/01/13 , 48  yrs. old GENDER: male ENDOSCOPIST: R.  Roetta SessionsMichael Kenadee Gates, MD FACP [facility] REFERRED BY: PROCEDURE DATE:  10/14/2014 PROCEDURE:   Colonoscopy, screening INDICATIONS:First ever average risk colorectal cancer screening examination. MEDICATIONS: Versed 6 mg IV and Demerol 75 mg IV in divided doses. Zofran 4 mg IV. ASA CLASS:       Class II  CONSENT: The risks, benefits, alternatives and imponderables including but not limited to bleeding, perforation as well as the possibility of a missed lesion have been reviewed.  The potential for biopsy, lesion removal, etc. have also been discussed. Questions have been answered.  All parties agreeable.  Please see the history and physical in the medical record for more information.  DESCRIPTION OF PROCEDURE:   After the risks benefits and alternatives of the procedure were thoroughly explained, informed consent was obtained.  The digital rectal exam revealed no rectal mass and revealed no abnormalities of the rectum.   The [id number] ([id number]  endoscope was introduced through the anus and advanced to the cecum, which was identified by both the appendix and ileocecal valve. No adverse events experienced.   The quality of the prep was adequate.  The instrument was then slowly withdrawn as the colon was fully examined.      COLON FINDINGS: Normal-appearing rectal mucosa.  Normal-appearing colonic mucosa.  Retroflexion was performed. .  Withdrawal time=8 minutes 0 seconds.  The scope was withdrawn and the procedure completed. COMPLICATIONS: There were no immediate complications.  ENDOSCOPIC IMPRESSION: Normal colonoscopy. Blood sugar fasting this morning over 200  RECOMMENDATIONS: Follow-up with Dr. Gerda DissLuking. Repeat colonoscopy in 10 years for screening purposes.  eSigned:  R. Roetta SessionsMichael  Jacquita Mulhearn, MD FACP [facility] 10/14/2014 9:30 AM   cc:  CPT CODES: ICD CODES:  The ICD and CPT codes recommended by this software are interpretations from the data that the clinical staff has captured with the software.  The verification of the translation of this report to the ICD and CPT codes and modifiers is the sole responsibility of the health care institution and practicing physician where this report was generated.  [employer] will not be held responsible for the validity of the ICD and CPT codes included on this report.  AMA assumes no liability for data contained or not contained herein. CPT is a Publishing rights managerregistered trademark of the [employer].  PATIENT NAMBridgett Larsson:  [name], Corey MR#: [id number]

## 2014-10-14 NOTE — H&P (Signed)
@LOGO@   Primary Care Physician:  LUKING,W S, MD Primary Gastroenterologist:  Dr. Rourk  Pre-Procedure History & Physical: HPI:  Corey [name] is a 48 y.o. male is here for a screening colonoscopy. No bowel symptoms. No family history of colon cancer in first degree relatives. An uncle and a grandfather may have had colon cancer but at advanced age.  Past Medical History  Diagnosis Date  . Diabetes mellitus without complication     Type 2  . Hypertension   . Hypertriglyceridemia     Past Surgical History  Procedure Laterality Date  . No past surgeries      Prior to Admission medications   Medication Sig Start Date End Date Taking? Authorizing Provider  atorvastatin (LIPITOR) 10 MG tablet TAKE 1 TABLET (10 MG TOTAL) BY MOUTH DAILY. 09/21/14  Yes William S Luking, MD  fenofibrate 160 MG tablet TAKE 1 TABLET (160 MG TOTAL) BY MOUTH DAILY. 09/21/14  Yes William S Luking, MD  glyBURIDE (DIABETA) 5 MG tablet TAKE 2 TABLETS BY MOUTH TWICE A DAY 09/21/14  Yes William S Luking, MD  lisinopril (PRINIVIL,ZESTRIL) 10 MG tablet Take 1 tablet (10 mg total) by mouth daily. 10/09/14  Yes William S Luking, MD  metFORMIN (GLUCOPHAGE) 500 MG tablet TAKE 2 TABLETS BY MOUTH EVERY MORNING, 1 TABLET AT NOON, AND 2 TABLETS IN THE EVENING 09/21/14  Yes William S Luking, MD  ONE TOUCH ULTRA TEST test strip USE AS DIRECTED 03/26/14  Yes William S Luking, MD  peg 3350 powder (MOVIPREP) 100 G SOLR Take 1 kit (200 g total) by mouth as directed. 09/24/14  Yes Robert M Rourk, MD  canagliflozin (INVOKANA) 100 MG TABS tablet Take 1 tablet (100 mg total) by mouth daily. 10/09/14   William S Luking, MD  canagliflozin (INVOKANA) 300 MG TABS tablet Take 300 mg by mouth daily before breakfast. 10/09/14   William S Luking, MD    Allergies as of 09/22/2014  . (No Known Allergies)    Family History  Problem Relation Age of Onset  . Cancer Father   . Hypertension Father   . Diabetes Father   . Heart attack Father   .  Colon cancer Other   . Colon cancer Maternal Uncle     History   Social History  . Marital Status: Married    Spouse Name: N/A    Number of Children: N/A  . Years of Education: N/A   Occupational History  . Not on file.   Social History Main Topics  . Smoking status: Former Smoker -- 0.50 packs/day for 10 years    Types: Cigarettes    Quit date: 02/07/1993  . Smokeless tobacco: Not on file  . Alcohol Use: No  . Drug Use: No  . Sexual Activity: Not on file   Other Topics Concern  . Not on file   Social History Narrative    Review of Systems: See HPI, otherwise negative ROS  Physical Exam: BP 141/96 mmHg  Pulse 100  Temp(Src) 98.4 F (36.9 C) (Oral)  Resp 19  Ht 5' 7" (1.702 m)  Wt 212 lb (96.163 kg)  BMI 33.20 kg/m2  SpO2 100% General:   Alert,  Well-developed, well-nourished, pleasant and cooperative in NAD Head:  Normocephalic and atraumatic. Eyes:  Sclera clear, no icterus.   Conjunctiva pink. Ears:  Normal auditory acuity. Nose:  No deformity, discharge,  or lesions. Mouth:  No deformity or lesions, dentition normal. Neck:  Supple; no masses or thyromegaly. Lungs:    Clear throughout to auscultation.   No wheezes, crackles, or rhonchi. No acute distress. Heart:  Regular rate and rhythm; no murmurs, clicks, rubs,  or gallops. Abdomen:  Soft, nontender and nondistended. No masses, hepatosplenomegaly or hernias noted. Normal bowel sounds, without guarding, and without rebound.   Msk:  Symmetrical without gross deformities. Normal posture. Pulses:  Normal pulses noted. Extremities:  Without clubbing or edema. Neurologic:  Alert and  oriented x4;  grossly normal neurologically. Skin:  Intact without significant lesions or rashes. Cervical Nodes:  No significant cervical adenopathy. Psych:  Alert and cooperative. Normal mood and affect.  Impression/Plan: Corey [name] is now here to undergo a screening colonoscopy.  First ever average risk screening  examination. Risks, benefits, limitations, imponderables and alternatives regarding colonoscopy have been reviewed with the patient. Questions have been answered. All parties agreeable.     Notice:  This dictation was prepared with Dragon dictation along with smaller phrase technology. Any transcriptional errors that result from this process are unintentional and may not be corrected upon review.

## 2014-10-14 NOTE — Discharge Instructions (Signed)
°  Colonoscopy Discharge Instructions  Read the instructions outlined below and refer to this sheet in the next few weeks. These discharge instructions provide you with general information on caring for yourself after you leave the hospital. Your doctor may also give you specific instructions. While your treatment has been planned according to the most current medical practices available, unavoidable complications occasionally occur. If you have any problems or questions after discharge, call Dr. Jena Gaussourk at [phone]. ACTIVITY  You may resume your regular activity, but move at a slower pace for the next 24 hours.   Take frequent rest periods for the next 24 hours.   Walking will help get rid of the air and reduce the bloated feeling in your belly (abdomen).   No driving for 24 hours (because of the medicine (anesthesia) used during the test).    Do not sign any important legal documents or operate any machinery for 24 hours (because of the anesthesia used during the test).  NUTRITION  Drink plenty of fluids.   You may resume your normal diet as instructed by your doctor.   Begin with a light meal and progress to your normal diet. Heavy or fried foods are harder to digest and may make you feel sick to your stomach (nauseated).   Avoid alcoholic beverages for 24 hours or as instructed.  MEDICATIONS  You may resume your normal medications unless your doctor tells you otherwise.  WHAT YOU CAN EXPECT TODAY  Some feelings of bloating in the abdomen.   Passage of more gas than usual.   Spotting of blood in your stool or on the toilet paper.  IF YOU HAD POLYPS REMOVED DURING THE COLONOSCOPY:  No aspirin products for 7 days or as instructed.   No alcohol for 7 days or as instructed.   Eat a soft diet for the next 24 hours.  FINDING OUT THE RESULTS OF YOUR TEST Not all test results are available during your visit. If your test results are not back during the visit, make an appointment  with your caregiver to find out the results. Do not assume everything is normal if you have not heard from your caregiver or the medical facility. It is important for you to follow up on all of your test results.  SEEK IMMEDIATE MEDICAL ATTENTION IF:  You have more than a spotting of blood in your stool.   Your belly is swollen (abdominal distention).   You are nauseated or vomiting.   You have a temperature over 101.  >    Notify MD if abdominal worsens as the day goes on   Recommend repeat colonoscopy in 10 years for screening purposes  Follow-up with Dr. Gerda DissLuking regarding management of diabetes

## 2014-10-16 ENCOUNTER — Other Ambulatory Visit: Payer: Self-pay | Admitting: Family Medicine

## 2014-10-19 ENCOUNTER — Encounter (HOSPITAL_COMMUNITY): Payer: Self-pay | Admitting: Internal Medicine

## 2014-11-12 ENCOUNTER — Other Ambulatory Visit: Payer: Self-pay | Admitting: Family Medicine

## 2014-11-16 ENCOUNTER — Other Ambulatory Visit: Payer: Self-pay | Admitting: Family Medicine

## 2015-02-25 ENCOUNTER — Other Ambulatory Visit: Payer: Self-pay | Admitting: Family Medicine

## 2015-02-26 ENCOUNTER — Encounter: Payer: Self-pay | Admitting: Family Medicine

## 2015-03-06 ENCOUNTER — Other Ambulatory Visit: Payer: Self-pay | Admitting: Family Medicine

## 2015-03-09 ENCOUNTER — Telehealth: Payer: Self-pay | Admitting: Family Medicine

## 2015-03-09 DIAGNOSIS — E785 Hyperlipidemia, unspecified: Secondary | ICD-10-CM

## 2015-03-09 DIAGNOSIS — Z125 Encounter for screening for malignant neoplasm of prostate: Secondary | ICD-10-CM

## 2015-03-09 DIAGNOSIS — Z79899 Other long term (current) drug therapy: Secondary | ICD-10-CM

## 2015-03-09 DIAGNOSIS — E119 Type 2 diabetes mellitus without complications: Secondary | ICD-10-CM

## 2015-03-09 NOTE — Telephone Encounter (Signed)
Lip liv psa A1c

## 2015-03-09 NOTE — Telephone Encounter (Signed)
Pt is requesting lab orders to be sent over for his upcoming appt on Fri. Last labs per epic were: lipid,hepatic,a1c,bmp,tsh,and microalbumin.

## 2015-03-09 NOTE — Telephone Encounter (Signed)
Left message on voicemail notifying patient bloodwork has been ordered.  

## 2015-03-10 LAB — LIPID PANEL
CHOL/HDL RATIO: 6.6 ratio — AB (ref 0.0–5.0)
CHOLESTEROL TOTAL: 125 mg/dL (ref 100–199)
HDL: 19 mg/dL — ABNORMAL LOW (ref >39)
LDL CALC: 57 mg/dL (ref 0–99)
TRIGLYCERIDES: 243 mg/dL — AB (ref 0–149)
VLDL Cholesterol Cal: 49 mg/dL — ABNORMAL HIGH (ref 5–40)

## 2015-03-10 LAB — PSA: Prostate Specific Ag, Serum: 0.6 ng/mL (ref 0.0–4.0)

## 2015-03-10 LAB — HEPATIC FUNCTION PANEL
ALT: 32 IU/L (ref 0–44)
AST: 26 IU/L (ref 0–40)
Albumin: 4.6 g/dL (ref 3.5–5.5)
Alkaline Phosphatase: 27 IU/L — ABNORMAL LOW (ref 39–117)
Bilirubin Total: 0.4 mg/dL (ref 0.0–1.2)
Bilirubin, Direct: 0.16 mg/dL (ref 0.00–0.40)
Total Protein: 7.7 g/dL (ref 6.0–8.5)

## 2015-03-10 LAB — HEMOGLOBIN A1C
Est. average glucose Bld gHb Est-mCnc: 192 mg/dL
Hgb A1c MFr Bld: 8.3 % — ABNORMAL HIGH (ref 4.8–5.6)

## 2015-03-12 ENCOUNTER — Encounter: Payer: Self-pay | Admitting: Family Medicine

## 2015-03-12 ENCOUNTER — Ambulatory Visit (INDEPENDENT_AMBULATORY_CARE_PROVIDER_SITE_OTHER): Payer: BLUE CROSS/BLUE SHIELD | Admitting: Family Medicine

## 2015-03-12 VITALS — BP 130/90 | Ht 67.0 in | Wt 201.0 lb

## 2015-03-12 DIAGNOSIS — E119 Type 2 diabetes mellitus without complications: Secondary | ICD-10-CM | POA: Diagnosis not present

## 2015-03-12 DIAGNOSIS — Z0000 Encounter for general adult medical examination without abnormal findings: Secondary | ICD-10-CM | POA: Diagnosis not present

## 2015-03-12 DIAGNOSIS — I10 Essential (primary) hypertension: Secondary | ICD-10-CM

## 2015-03-12 MED ORDER — METFORMIN HCL 500 MG PO TABS: ORAL_TABLET | ORAL | Status: DC

## 2015-03-12 MED ORDER — LOSARTAN POTASSIUM 50 MG PO TABS: 50.0000 mg | ORAL_TABLET | Freq: Every day | ORAL | Status: DC

## 2015-03-12 NOTE — Progress Notes (Signed)
Subjective:    Patient ID: Corey [name], male    DOB: 01-14-1966, 49 y.o.   MRN: [id number]  HPI The patient comes in today for a wellness visit.    A review of their health history was completed.  A review of medications was also completed.  Any needed refills: none  Eating habits: good  Falls/  MVA accidents in past few months: none  Regular exercise: not really  Specialist pt sees on regular basis: none  Preventative health issues were discussed.   Additional concerns: Patient states that he has a cough. This has been present for about 2-3 months now.   Results for orders placed or performed in visit on 03/09/15  Lipid panel  Result Value Ref Range   Cholesterol, Total 125 100 - 199 mg/dL   Triglycerides 409243 (H) 0 - 149 mg/dL   HDL 19 (L) >81>39 mg/dL   VLDL Cholesterol Cal 49 (H) 5 - 40 mg/dL   LDL Calculated 57 0 - 99 mg/dL   Chol/HDL Ratio 6.6 (H) 0.0 - 5.0 ratio units  Hepatic function panel  Result Value Ref Range   Total Protein 7.7 6.0 - 8.5 g/dL   Albumin 4.6 3.5 - 5.5 g/dL   Bilirubin Total 0.4 0.0 - 1.2 mg/dL   Bilirubin, Direct 1.910.16 0.00 - 0.40 mg/dL   Alkaline Phosphatase 27 (L) 39 - 117 IU/L   AST 26 0 - 40 IU/L   ALT 32 0 - 44 IU/L  PSA  Result Value Ref Range   Prostate Specific Ag, Serum 0.6 0.0 - 4.0 ng/mL  Hemoglobin A1c  Result Value Ref Range   Hgb A1c MFr Bld 8.3 (H) 4.8 - 5.6 %   Est. average glucose Bld gHb Est-mCnc 192 mg/dL   Sugars running fairly well  Diet overall is ok, eats a lot of fruits and veggies,  Not a lot of exercise these days, mybe twice per wk of thirty min walking   Review of Systems  Constitutional: Negative for fever, activity change and appetite change.  HENT: Negative for congestion and rhinorrhea.   Eyes: Negative for discharge.  Respiratory: Negative for cough and wheezing.   Cardiovascular: Negative for chest pain.  Gastrointestinal: Negative for vomiting, abdominal pain and blood in stool.    Genitourinary: Negative for frequency and difficulty urinating.  Musculoskeletal: Negative for neck pain.  Skin: Negative for rash.  Allergic/Immunologic: Negative for environmental allergies and food allergies.  Neurological: Negative for weakness and headaches.  Psychiatric/Behavioral: Negative for agitation.  All other systems reviewed and are negative.      Objective:   Physical Exam  Constitutional: He appears well-developed and well-nourished.  HENT:  Head: Normocephalic and atraumatic.  Right Ear: External ear normal.  Left Ear: External ear normal.  Nose: Nose normal.  Mouth/Throat: Oropharynx is clear and moist.  Eyes: EOM are normal. Pupils are equal, round, and reactive to light.  Neck: Normal range of motion. Neck supple. No thyromegaly present.  Cardiovascular: Normal rate, regular rhythm and normal heart sounds.   No murmur heard. Pulmonary/Chest: Effort normal and breath sounds normal. No respiratory distress. He has no wheezes.  Abdominal: Soft. Bowel sounds are normal. He exhibits no distension and no mass. There is no tenderness.  Genitourinary: Penis normal.  Musculoskeletal: Normal range of motion. He exhibits no edema.  Lymphadenopathy:    He has no cervical adenopathy.  Neurological: He is alert. He exhibits normal muscle tone.  Skin: Skin is warm and dry. No  erythema.  Psychiatric: He has a normal mood and affect. His behavior is normal. Judgment normal.  Vitals reviewed.         Assessment & Plan:  Impression 1 wellness exam #2 type 2 diabetes suboptimum discuss #3 chronic cough likely secondary to lisinopril discussed #4 hypertension good control discussed #5 hyperlipidemia improving discussed plan stop lisinopril. Initiate losartan. 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 Diet exercise discussed from encourage. Patient to follow-up as scheduled with hopeful improvement by then. Already up-to-date on  colonoscopy WSL

## 2015-03-30 ENCOUNTER — Other Ambulatory Visit: Payer: Self-pay | Admitting: Family Medicine

## 2015-04-01 ENCOUNTER — Other Ambulatory Visit: Payer: Self-pay | Admitting: Family Medicine

## 2015-04-14 ENCOUNTER — Other Ambulatory Visit: Payer: Self-pay | Admitting: Family Medicine

## 2015-04-17 ENCOUNTER — Other Ambulatory Visit: Payer: Self-pay | Admitting: Family Medicine

## 2015-04-27 ENCOUNTER — Encounter: Payer: Self-pay | Admitting: Family Medicine

## 2015-04-27 ENCOUNTER — Ambulatory Visit (INDEPENDENT_AMBULATORY_CARE_PROVIDER_SITE_OTHER): Payer: BLUE CROSS/BLUE SHIELD | Admitting: Family Medicine

## 2015-04-27 VITALS — BP 132/78 | Temp 99.0°F | Ht 67.0 in | Wt 207.0 lb

## 2015-04-27 DIAGNOSIS — R21 Rash and other nonspecific skin eruption: Secondary | ICD-10-CM | POA: Diagnosis not present

## 2015-04-27 MED ORDER — BLOOD GLUCOSE MONITOR KIT
PACK | Status: DC
Start: 2015-04-27 — End: 2024-03-31

## 2015-04-27 MED ORDER — DOXYCYCLINE HYCLATE 100 MG PO TABS: 100.0000 mg | ORAL_TABLET | Freq: Two times a day (BID) | ORAL | Status: DC

## 2015-04-27 NOTE — Progress Notes (Signed)
   Subjective:    Patient ID: Corey [name], male    DOB: 1966/06/25, 49 y.o.   MRN: [id number]  HPIAbscess on right thigh. Came up about 5 days ago. Painful to touch. Tried peroxide.   Started like a pimple  Lasted for past wk  Did squeaze some pus out of it  No fevr no chills  No usual hx of this    Does have known diabetes and suboptimum control  Review of Systems No fever no chills no headache no back pain    Objective:   Physical Exam  Alert vitals stable. Lungs clear heart rare rhythm right anterior thigh palpable nodular erythematous crusty moderately tender region no fluctuance   impression skin structure infection, dictated by suboptimum diabetes control. Local measures discussed anti-bodies prescribed. WSL      Assessment & Plan:  See above

## 2015-05-14 ENCOUNTER — Other Ambulatory Visit: Payer: Self-pay | Admitting: Family Medicine

## 2015-06-10 ENCOUNTER — Other Ambulatory Visit: Payer: Self-pay | Admitting: Family Medicine

## 2015-08-17 ENCOUNTER — Other Ambulatory Visit: Payer: Self-pay | Admitting: *Deleted

## 2015-08-17 ENCOUNTER — Other Ambulatory Visit: Payer: Self-pay | Admitting: Family Medicine

## 2015-08-17 ENCOUNTER — Telehealth: Payer: Self-pay | Admitting: Family Medicine

## 2015-08-17 DIAGNOSIS — E119 Type 2 diabetes mellitus without complications: Secondary | ICD-10-CM

## 2015-08-17 DIAGNOSIS — E785 Hyperlipidemia, unspecified: Secondary | ICD-10-CM

## 2015-08-17 DIAGNOSIS — Z79899 Other long term (current) drug therapy: Secondary | ICD-10-CM

## 2015-08-17 NOTE — Telephone Encounter (Addendum)
Dr Richardson Landry ordered Lipid, Liver, Met 7 and Hgb A1c. Blood work ordered in [employer].  Papers given to patient.

## 2015-08-17 NOTE — Telephone Encounter (Signed)
Patient needs order for BW. °

## 2015-08-18 LAB — HEPATIC FUNCTION PANEL
ALK PHOS: 31 IU/L — AB (ref 39–117)
ALT: 42 IU/L (ref 0–44)
AST: 39 IU/L (ref 0–40)
Albumin: 4.8 g/dL (ref 3.5–5.5)
Bilirubin Total: 0.5 mg/dL (ref 0.0–1.2)
Bilirubin, Direct: 0.17 mg/dL (ref 0.00–0.40)
TOTAL PROTEIN: 8 g/dL (ref 6.0–8.5)

## 2015-08-18 LAB — BASIC METABOLIC PANEL
BUN/Creatinine Ratio: 9 (ref 9–20)
BUN: 9 mg/dL (ref 6–24)
CALCIUM: 10.3 mg/dL — AB (ref 8.7–10.2)
CHLORIDE: 104 mmol/L (ref 97–106)
CO2: 23 mmol/L (ref 18–29)
CREATININE: 0.95 mg/dL (ref 0.76–1.27)
GFR, EST AFRICAN AMERICAN: 107 mL/min/{1.73_m2} (ref >59)
GFR, EST NON AFRICAN AMERICAN: 92 mL/min/{1.73_m2} (ref >59)
Glucose: 87 mg/dL (ref 65–99)
Potassium: 4.4 mmol/L (ref 3.5–5.2)
Sodium: 144 mmol/L (ref 136–144)

## 2015-08-18 LAB — HEMOGLOBIN A1C
ESTIMATED AVERAGE GLUCOSE: 186 mg/dL
Hgb A1c MFr Bld: 8.1 % — ABNORMAL HIGH (ref 4.8–5.6)

## 2015-08-18 LAB — LIPID PANEL
Chol/HDL Ratio: 6.4 ratio units — ABNORMAL HIGH (ref 0.0–5.0)
Cholesterol, Total: 128 mg/dL (ref 100–199)
HDL: 20 mg/dL — AB (ref >39)
LDL CALC: 57 mg/dL (ref 0–99)
Triglycerides: 254 mg/dL — ABNORMAL HIGH (ref 0–149)
VLDL CHOLESTEROL CAL: 51 mg/dL — AB (ref 5–40)

## 2015-08-24 ENCOUNTER — Encounter: Payer: Self-pay | Admitting: Family Medicine

## 2015-08-24 ENCOUNTER — Ambulatory Visit (INDEPENDENT_AMBULATORY_CARE_PROVIDER_SITE_OTHER): Payer: BLUE CROSS/BLUE SHIELD | Admitting: Family Medicine

## 2015-08-24 VITALS — BP 140/86 | Ht 67.0 in | Wt 207.0 lb

## 2015-08-24 DIAGNOSIS — I10 Essential (primary) hypertension: Secondary | ICD-10-CM

## 2015-08-24 DIAGNOSIS — Z23 Encounter for immunization: Secondary | ICD-10-CM

## 2015-08-24 DIAGNOSIS — E782 Mixed hyperlipidemia: Secondary | ICD-10-CM

## 2015-08-24 DIAGNOSIS — E119 Type 2 diabetes mellitus without complications: Secondary | ICD-10-CM | POA: Diagnosis not present

## 2015-08-24 MED ORDER — GLIPIZIDE 5 MG PO TABS: 10.0000 mg | ORAL_TABLET | Freq: Two times a day (BID) | ORAL | Status: DC

## 2015-08-24 NOTE — Progress Notes (Signed)
   Subjective:    Patient ID: Corey [name], male    DOB: 03/01/1966, 49 y.o.   MRN: [id number]  Diabetes He presents for his follow-up diabetic visit. He has type 2 diabetes mellitus. There are no hypoglycemic associated symptoms. There are no diabetic associated symptoms. There are no hypoglycemic complications. There are no diabetic complications. There are no known risk factors for coronary artery disease. Current diabetic treatment includes oral agent (dual therapy). He is compliant with treatment all of the time.   Last diabetic eye exam- over 2 years ago.  Results for orders placed or performed in visit on 08/17/15  Basic metabolic panel  Result Value Ref Range   Glucose 87 65 - 99 mg/dL   BUN 9 6 - 24 mg/dL   Creatinine, Ser 4.090.95 0.76 - 1.27 mg/dL   GFR calc non Af Amer 92 >59 mL/min/1.73   GFR calc Af Amer 107 >59 mL/min/1.73   BUN/Creatinine Ratio 9 9 - 20   Sodium 144 136 - 144 mmol/L   Potassium 4.4 3.5 - 5.2 mmol/L   Chloride 104 97 - 106 mmol/L   CO2 23 18 - 29 mmol/L   Calcium 10.3 (H) 8.7 - 10.2 mg/dL  Lipid panel  Result Value Ref Range   Cholesterol, Total 128 100 - 199 mg/dL   Triglycerides 811254 (H) 0 - 149 mg/dL   HDL 20 (L) >91>39 mg/dL   VLDL Cholesterol Cal 51 (H) 5 - 40 mg/dL   LDL Calculated 57 0 - 99 mg/dL   Chol/HDL Ratio 6.4 (H) 0.0 - 5.0 ratio units  Hepatic function panel  Result Value Ref Range   Total Protein 8.0 6.0 - 8.5 g/dL   Albumin 4.8 3.5 - 5.5 g/dL   Bilirubin Total 0.5 0.0 - 1.2 mg/dL   Bilirubin, Direct 4.780.17 0.00 - 0.40 mg/dL   Alkaline Phosphatase 31 (L) 39 - 117 IU/L   AST 39 0 - 40 IU/L   ALT 42 0 - 44 IU/L  Hemoglobin A1c  Result Value Ref Range   Hgb A1c MFr Bld 8.1 (H) 4.8 - 5.6 %   Est. average glucose Bld gHb Est-mCnc 186 mg/dL     Pt knows sugars higher in the morn,   Pt would like to go ahead an o flu shot after further discusiion   150 to 200 , then 80s in the eve  Not much exercise these days next  Patient claims  compliance with blood pressure medication. No obvious side effects. Medications reviewed today has watch salt intake.  Compliant with triglyceride medication. Medication reviewed. No obvious side effects watching fats in diet.     Patient states that he has no concerns at this time.   Flu vaccine- declined  Review of Systems No headache no chest pain no back pain no abdominal pain review systems otherwise negative    Objective:   Physical Exam  Alert blood pressure good on repeat HEENT normal lungs clear. 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 Diet exercise discussed patient claims to work harder on diet WSL

## 2015-09-08 ENCOUNTER — Other Ambulatory Visit: Payer: Self-pay | Admitting: Family Medicine

## 2015-10-04 ENCOUNTER — Other Ambulatory Visit: Payer: Self-pay | Admitting: Family Medicine

## 2015-10-08 ENCOUNTER — Other Ambulatory Visit: Payer: Self-pay | Admitting: Family Medicine

## 2015-11-16 ENCOUNTER — Other Ambulatory Visit: Payer: Self-pay | Admitting: Family Medicine

## 2015-11-24 ENCOUNTER — Other Ambulatory Visit: Payer: Self-pay | Admitting: Family Medicine

## 2015-12-21 ENCOUNTER — Other Ambulatory Visit: Payer: Self-pay | Admitting: Family Medicine

## 2016-01-20 ENCOUNTER — Ambulatory Visit: Payer: BLUE CROSS/BLUE SHIELD | Admitting: Family Medicine

## 2016-01-26 ENCOUNTER — Other Ambulatory Visit: Payer: Self-pay

## 2016-01-26 MED ORDER — ATORVASTATIN CALCIUM 10 MG PO TABS: 10.0000 mg | ORAL_TABLET | Freq: Every day | ORAL | Status: DC

## 2016-02-01 ENCOUNTER — Telehealth: Payer: Self-pay | Admitting: Family Medicine

## 2016-02-01 DIAGNOSIS — Z79899 Other long term (current) drug therapy: Secondary | ICD-10-CM

## 2016-02-01 DIAGNOSIS — E119 Type 2 diabetes mellitus without complications: Secondary | ICD-10-CM

## 2016-02-01 DIAGNOSIS — E785 Hyperlipidemia, unspecified: Secondary | ICD-10-CM

## 2016-02-01 DIAGNOSIS — Z125 Encounter for screening for malignant neoplasm of prostate: Secondary | ICD-10-CM

## 2016-02-01 NOTE — Telephone Encounter (Signed)
Lip liv m7 A1c Psa Microprot u a

## 2016-02-01 NOTE — Telephone Encounter (Signed)
Blood work ordered in [facility]. Patient notified. 

## 2016-02-01 NOTE — Telephone Encounter (Signed)
Patient is requesting orders for labwork for appointment on Monday 02/07/16.

## 2016-02-02 DIAGNOSIS — E119 Type 2 diabetes mellitus without complications: Secondary | ICD-10-CM | POA: Diagnosis not present

## 2016-02-02 DIAGNOSIS — Z125 Encounter for screening for malignant neoplasm of prostate: Secondary | ICD-10-CM | POA: Diagnosis not present

## 2016-02-02 DIAGNOSIS — Z79899 Other long term (current) drug therapy: Secondary | ICD-10-CM | POA: Diagnosis not present

## 2016-02-02 DIAGNOSIS — E785 Hyperlipidemia, unspecified: Secondary | ICD-10-CM | POA: Diagnosis not present

## 2016-02-03 LAB — HEPATIC FUNCTION PANEL
ALBUMIN: 4.6 g/dL (ref 3.5–5.5)
ALK PHOS: 29 IU/L — AB (ref 39–117)
ALT: 31 IU/L (ref 0–44)
AST: 27 IU/L (ref 0–40)
BILIRUBIN TOTAL: 0.5 mg/dL (ref 0.0–1.2)
BILIRUBIN, DIRECT: 0.21 mg/dL (ref 0.00–0.40)
TOTAL PROTEIN: 8.4 g/dL (ref 6.0–8.5)

## 2016-02-03 LAB — BASIC METABOLIC PANEL
BUN / CREAT RATIO: 14 (ref 9–20)
BUN: 15 mg/dL (ref 6–24)
CO2: 24 mmol/L (ref 18–29)
CREATININE: 1.06 mg/dL (ref 0.76–1.27)
Calcium: 10.2 mg/dL (ref 8.7–10.2)
Chloride: 98 mmol/L (ref 96–106)
GFR, EST AFRICAN AMERICAN: 93 mL/min/{1.73_m2} (ref >59)
GFR, EST NON AFRICAN AMERICAN: 80 mL/min/{1.73_m2} (ref >59)
Glucose: 88 mg/dL (ref 65–99)
POTASSIUM: 4.8 mmol/L (ref 3.5–5.2)
SODIUM: 138 mmol/L (ref 134–144)

## 2016-02-03 LAB — MICROALBUMIN / CREATININE URINE RATIO
Creatinine, Urine: 73.3 mg/dL
MICROALB/CREAT RATIO: 49.2 mg/g{creat} — AB (ref 0.0–30.0)
Microalbumin, Urine: 36.1 ug/mL

## 2016-02-03 LAB — HEMOGLOBIN A1C
ESTIMATED AVERAGE GLUCOSE: 183 mg/dL
Hgb A1c MFr Bld: 8 % — ABNORMAL HIGH (ref 4.8–5.6)

## 2016-02-03 LAB — LIPID PANEL
CHOL/HDL RATIO: 7.3 ratio — AB (ref 0.0–5.0)
Cholesterol, Total: 138 mg/dL (ref 100–199)
HDL: 19 mg/dL — ABNORMAL LOW (ref >39)
LDL CALC: 56 mg/dL (ref 0–99)
TRIGLYCERIDES: 313 mg/dL — AB (ref 0–149)
VLDL Cholesterol Cal: 63 mg/dL — ABNORMAL HIGH (ref 5–40)

## 2016-02-03 LAB — PSA: PROSTATE SPECIFIC AG, SERUM: 0.6 ng/mL (ref 0.0–4.0)

## 2016-02-07 ENCOUNTER — Ambulatory Visit (INDEPENDENT_AMBULATORY_CARE_PROVIDER_SITE_OTHER): Payer: BLUE CROSS/BLUE SHIELD | Admitting: Family Medicine

## 2016-02-07 ENCOUNTER — Other Ambulatory Visit: Payer: Self-pay | Admitting: *Deleted

## 2016-02-07 ENCOUNTER — Encounter: Payer: Self-pay | Admitting: Family Medicine

## 2016-02-07 VITALS — BP 132/86 | Ht 67.0 in | Wt 192.5 lb

## 2016-02-07 DIAGNOSIS — E785 Hyperlipidemia, unspecified: Secondary | ICD-10-CM | POA: Diagnosis not present

## 2016-02-07 DIAGNOSIS — E119 Type 2 diabetes mellitus without complications: Secondary | ICD-10-CM

## 2016-02-07 DIAGNOSIS — I10 Essential (primary) hypertension: Secondary | ICD-10-CM

## 2016-02-07 MED ORDER — FENOFIBRATE 160 MG PO TABS: 160.0000 mg | ORAL_TABLET | Freq: Every day | ORAL | Status: DC

## 2016-02-07 MED ORDER — LOSARTAN POTASSIUM 50 MG PO TABS: ORAL_TABLET | ORAL | Status: DC

## 2016-02-07 MED ORDER — GLUCOSE BLOOD VI STRP: ORAL_STRIP | Status: DC

## 2016-02-07 MED ORDER — ATORVASTATIN CALCIUM 10 MG PO TABS: 10.0000 mg | ORAL_TABLET | Freq: Every day | ORAL | Status: DC

## 2016-02-07 MED ORDER — METFORMIN HCL 500 MG PO TABS
ORAL_TABLET | ORAL | Status: DC
Start: 2016-02-07 — End: 2016-07-12

## 2016-02-07 MED ORDER — CANAGLIFLOZIN 300 MG PO TABS: ORAL_TABLET | ORAL | Status: DC

## 2016-02-07 MED ORDER — GLIPIZIDE 5 MG PO TABS: 10.0000 mg | ORAL_TABLET | Freq: Two times a day (BID) | ORAL | Status: DC

## 2016-02-07 MED ORDER — BLOOD GLUCOSE MONITOR KIT: PACK | Status: DC

## 2016-02-07 NOTE — Progress Notes (Signed)
Subjective:    Patient ID: Corey [name], male    DOB: May 28, 1966, 50 y.o.   MRN: [id number] Patient arrives office with multiple concerns Diabetes He presents for his follow-up diabetic visit. He has type 2 diabetes mellitus. There are no hypoglycemic associated symptoms. There are no diabetic associated symptoms. There are no hypoglycemic complications. There are no diabetic complications. There are no known risk factors for coronary artery disease. Current diabetic treatment includes oral agent (triple therapy). He is compliant with treatment all of the time.   Results for orders placed or performed in visit on 02/01/16  Lipid panel  Result Value Ref Range   Cholesterol, Total 138 100 - 199 mg/dL   Triglycerides 347313 (H) 0 - 149 mg/dL   HDL 19 (L) >42>39 mg/dL   VLDL Cholesterol Cal 63 (H) 5 - 40 mg/dL   LDL Calculated 56 0 - 99 mg/dL   Chol/HDL Ratio 7.3 (H) 0.0 - 5.0 ratio units  Hepatic function panel  Result Value Ref Range   Total Protein 8.4 6.0 - 8.5 g/dL   Albumin 4.6 3.5 - 5.5 g/dL   Bilirubin Total 0.5 0.0 - 1.2 mg/dL   Bilirubin, Direct 5.950.21 0.00 - 0.40 mg/dL   Alkaline Phosphatase 29 (L) 39 - 117 IU/L   AST 27 0 - 40 IU/L   ALT 31 0 - 44 IU/L  Basic metabolic panel  Result Value Ref Range   Glucose 88 65 - 99 mg/dL   BUN 15 6 - 24 mg/dL   Creatinine, Ser 6.381.06 0.76 - 1.27 mg/dL   GFR calc non Af Amer 80 >59 mL/min/1.73   GFR calc Af Amer 93 >59 mL/min/1.73   BUN/Creatinine Ratio 14 9 - 20   Sodium 138 134 - 144 mmol/L   Potassium 4.8 3.5 - 5.2 mmol/L   Chloride 98 96 - 106 mmol/L   CO2 24 18 - 29 mmol/L   Calcium 10.2 8.7 - 10.2 mg/dL  Hemoglobin V5IA1c  Result Value Ref Range   Hgb A1c MFr Bld 8.0 (H) 4.8 - 5.6 %   Est. average glucose Bld gHb Est-mCnc 183 mg/dL  Microalbumin / creatinine urine ratio  Result Value Ref Range   Creatinine, Urine 73.3 Not Estab. mg/dL   Microalbum.,U,Random 36.1 Not Estab. ug/mL   MICROALB/CREAT RATIO 49.2 (H) 0.0 - 30.0 mg/g creat    PSA  Result Value Ref Range   Prostate Specific Ag, Serum 0.6 0.0 - 4.0 ng/mL   Morn sugars improved past month, 119 etc.  Patient notes compliance with blood pressure medication. Does not miss a dose. Meds reviewed today. No obvious side effects.  Claims compliance with lipid medicines. Mixed lipid disorder states mostly watching diet. 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   Review of Systems No headache, no major weight loss or weight gain, no chest pain no back pain abdominal pain no change in bowel habits complete ROS otherwise negative     Objective:   Physical Exam  Alert vitals stable weight improved blood pressure 140/86 on repeatlungs clear heart rare rhythm ankles without edema      Assessment & Plan:  Impression 1 type 2 diabetes suboptimum control on 3 oral meds at maximum dose. 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 Diet exercise discussed recheck in 6 months WSL

## 2016-02-07 NOTE — Patient Instructions (Signed)
Results for orders placed or performed in visit on 02/01/16  Lipid panel  Result Value Ref Range   Cholesterol, Total 138 100 - 199 mg/dL   Triglycerides 782313 (H) 0 - 149 mg/dL   HDL 19 (L) >95>39 mg/dL   VLDL Cholesterol Cal 63 (H) 5 - 40 mg/dL   LDL Calculated 56 0 - 99 mg/dL   Chol/HDL Ratio 7.3 (H) 0.0 - 5.0 ratio units  Hepatic function panel  Result Value Ref Range   Total Protein 8.4 6.0 - 8.5 g/dL   Albumin 4.6 3.5 - 5.5 g/dL   Bilirubin Total 0.5 0.0 - 1.2 mg/dL   Bilirubin, Direct 6.210.21 0.00 - 0.40 mg/dL   Alkaline Phosphatase 29 (L) 39 - 117 IU/L   AST 27 0 - 40 IU/L   ALT 31 0 - 44 IU/L  Basic metabolic panel  Result Value Ref Range   Glucose 88 65 - 99 mg/dL   BUN 15 6 - 24 mg/dL   Creatinine, Ser 3.081.06 0.76 - 1.27 mg/dL   GFR calc non Af Amer 80 >59 mL/min/1.73   GFR calc Af Amer 93 >59 mL/min/1.73   BUN/Creatinine Ratio 14 9 - 20   Sodium 138 134 - 144 mmol/L   Potassium 4.8 3.5 - 5.2 mmol/L   Chloride 98 96 - 106 mmol/L   CO2 24 18 - 29 mmol/L   Calcium 10.2 8.7 - 10.2 mg/dL  Hemoglobin M5HA1c  Result Value Ref Range   Hgb A1c MFr Bld 8.0 (H) 4.8 - 5.6 %   Est. average glucose Bld gHb Est-mCnc 183 mg/dL  Microalbumin / creatinine urine ratio  Result Value Ref Range   Creatinine, Urine 73.3 Not Estab. mg/dL   Microalbum.,U,Random 36.1 Not Estab. ug/mL   MICROALB/CREAT RATIO 49.2 (H) 0.0 - 30.0 mg/g creat  PSA  Result Value Ref Range   Prostate Specific Ag, Serum 0.6 0.0 - 4.0 ng/mL

## 2016-02-08 ENCOUNTER — Other Ambulatory Visit: Payer: Self-pay | Admitting: *Deleted

## 2016-02-08 ENCOUNTER — Telehealth: Payer: Self-pay | Admitting: Family Medicine

## 2016-02-08 MED ORDER — BLOOD GLUCOSE MONITOR KIT: PACK | Status: DC

## 2016-02-08 NOTE — Telephone Encounter (Signed)
On 02/07/2016, patient had an Rx for one touch ultra test strips and a contour meter and testing kit sent to the pharmacy.  The pharmacy says that one touch ultra test strips are not covered by his insurance anymore.  Can we please send over contour test strips?  There are notes from where it was sent over originally saying that Contour was preferred.   [facility] 

## 2016-02-08 NOTE — Telephone Encounter (Signed)
Please call patient when complete. [phone]

## 2016-02-08 NOTE — Telephone Encounter (Signed)
Med sent to pharm. Pt notified.  

## 2016-02-08 NOTE — Telephone Encounter (Signed)
I verified with [employer] that Contour is covered.

## 2016-06-02 ENCOUNTER — Other Ambulatory Visit: Payer: Self-pay | Admitting: Family Medicine

## 2016-06-05 ENCOUNTER — Other Ambulatory Visit: Payer: Self-pay | Admitting: Family Medicine

## 2016-07-12 ENCOUNTER — Other Ambulatory Visit: Payer: Self-pay | Admitting: Family Medicine

## 2016-08-01 ENCOUNTER — Telehealth: Payer: Self-pay | Admitting: Family Medicine

## 2016-08-01 DIAGNOSIS — E119 Type 2 diabetes mellitus without complications: Secondary | ICD-10-CM

## 2016-08-01 DIAGNOSIS — E785 Hyperlipidemia, unspecified: Secondary | ICD-10-CM

## 2016-08-01 DIAGNOSIS — Z79899 Other long term (current) drug therapy: Secondary | ICD-10-CM

## 2016-08-01 NOTE — Telephone Encounter (Signed)
Pt has appt here 08/22/16  Needs lab orders to get done before   Please call when ready

## 2016-08-01 NOTE — Telephone Encounter (Signed)
Lip liv A1c 

## 2016-08-01 NOTE — Telephone Encounter (Signed)
Orders ready. Pt notified on voicemail.  

## 2016-08-14 DIAGNOSIS — Z79899 Other long term (current) drug therapy: Secondary | ICD-10-CM | POA: Diagnosis not present

## 2016-08-14 DIAGNOSIS — E785 Hyperlipidemia, unspecified: Secondary | ICD-10-CM | POA: Diagnosis not present

## 2016-08-14 DIAGNOSIS — E119 Type 2 diabetes mellitus without complications: Secondary | ICD-10-CM | POA: Diagnosis not present

## 2016-08-14 LAB — HM DIABETES EYE EXAM

## 2016-08-15 LAB — HEPATIC FUNCTION PANEL
ALBUMIN: 4.4 g/dL (ref 3.5–5.5)
ALT: 31 IU/L (ref 0–44)
AST: 26 IU/L (ref 0–40)
Alkaline Phosphatase: 31 IU/L — ABNORMAL LOW (ref 39–117)
BILIRUBIN TOTAL: 0.3 mg/dL (ref 0.0–1.2)
Bilirubin, Direct: 0.15 mg/dL (ref 0.00–0.40)
TOTAL PROTEIN: 7.3 g/dL (ref 6.0–8.5)

## 2016-08-15 LAB — HEMOGLOBIN A1C
ESTIMATED AVERAGE GLUCOSE: 174 mg/dL
Hgb A1c MFr Bld: 7.7 % — ABNORMAL HIGH (ref 4.8–5.6)

## 2016-08-15 LAB — LIPID PANEL
CHOL/HDL RATIO: 6.2 ratio — AB (ref 0.0–5.0)
Cholesterol, Total: 154 mg/dL (ref 100–199)
HDL: 25 mg/dL — ABNORMAL LOW (ref >39)
LDL CALC: 98 mg/dL (ref 0–99)
Triglycerides: 157 mg/dL — ABNORMAL HIGH (ref 0–149)
VLDL CHOLESTEROL CAL: 31 mg/dL (ref 5–40)

## 2016-08-22 ENCOUNTER — Encounter: Payer: Self-pay | Admitting: Family Medicine

## 2016-08-22 ENCOUNTER — Ambulatory Visit (INDEPENDENT_AMBULATORY_CARE_PROVIDER_SITE_OTHER): Payer: BLUE CROSS/BLUE SHIELD | Admitting: Family Medicine

## 2016-08-22 VITALS — BP 130/86 | Ht 67.0 in | Wt 195.2 lb

## 2016-08-22 DIAGNOSIS — Z23 Encounter for immunization: Secondary | ICD-10-CM | POA: Diagnosis not present

## 2016-08-22 DIAGNOSIS — E7800 Pure hypercholesterolemia, unspecified: Secondary | ICD-10-CM | POA: Diagnosis not present

## 2016-08-22 DIAGNOSIS — Z1159 Encounter for screening for other viral diseases: Secondary | ICD-10-CM

## 2016-08-22 DIAGNOSIS — E119 Type 2 diabetes mellitus without complications: Secondary | ICD-10-CM | POA: Diagnosis not present

## 2016-08-22 DIAGNOSIS — I10 Essential (primary) hypertension: Secondary | ICD-10-CM

## 2016-08-22 MED ORDER — CANAGLIFLOZIN 300 MG PO TABS: ORAL_TABLET | ORAL | 1 refills | Status: DC

## 2016-08-22 MED ORDER — GLIPIZIDE 5 MG PO TABS: ORAL_TABLET | ORAL | 1 refills | Status: DC

## 2016-08-22 NOTE — Progress Notes (Signed)
Subjective:    Patient ID: Corey [name], male    DOB: Sep 11, 1966, 50 y.o.   MRN: [id number]  Diabetes  He presents for his follow-up diabetic visit. He has type 2 diabetes mellitus. There are no hypoglycemic associated symptoms. There are no diabetic associated symptoms. There are no hypoglycemic complications. There are no diabetic complications. Current diabetic treatment includes oral agent (triple therapy). He is compliant with treatment all of the time.   Results for orders placed or performed in visit on 08/22/16  HM DIABETES EYE EXAM  Result Value Ref Range   HM Diabetic Eye Exam No Retinopathy No Retinopathy    Recent Results (from the past 2160 hour(s))  HM DIABETES EYE EXAM     Status: None   Collection Time: 08/14/16 12:00 AM  Result Value Ref Range   HM Diabetic Eye Exam No Retinopathy No Retinopathy  Lipid panel     Status: Abnormal   Collection Time: 08/14/16 11:00 AM  Result Value Ref Range   Cholesterol, Total 154 100 - 199 mg/dL   Triglycerides 409157 (H) 0 - 149 mg/dL   HDL 25 (L) >81>39 mg/dL   VLDL Cholesterol Cal 31 5 - 40 mg/dL   LDL Calculated 98 0 - 99 mg/dL   Chol/HDL Ratio 6.2 (H) 0.0 - 5.0 ratio units    Comment:                                   T. Chol/HDL Ratio                                             Men  Women                               1/2 Avg.Risk  3.4    3.3                                   Avg.Risk  5.0    4.4                                2X Avg.Risk  9.6    7.1                                3X Avg.Risk 23.4   11.0   Hepatic function panel     Status: Abnormal   Collection Time: 08/14/16 11:00 AM  Result Value Ref Range   Total Protein 7.3 6.0 - 8.5 g/dL   Albumin 4.4 3.5 - 5.5 g/dL   Bilirubin Total 0.3 0.0 - 1.2 mg/dL   Bilirubin, Direct 1.910.15 0.00 - 0.40 mg/dL   Alkaline Phosphatase 31 (L) 39 - 117 IU/L   AST 26 0 - 40 IU/L   ALT 31 0 - 44 IU/L  Hemoglobin A1c     Status: Abnormal   Collection Time: 08/14/16 11:00 AM  Result  Value Ref Range   Hgb A1c MFr Bld 7.7 (H) 4.8 - 5.6 %    Comment:  Pre-diabetes: 5.7 - 6.4          Diabetes: >6.4          Glycemic control for adults with diabetes: <7.0    Est. average glucose Bld gHb Est-mCnc 174 mg/dL    Patient had diabetic eye exam 08/14/16. No retinopathy.   Patient states that he has a knot on his left hand he wants the doctor to take a look at. Painful at times. Base of the ring finger. Recalls no sudden injury.   Having a lot of challenges with low sugar spells later in the morning. On further history frequently completely skips breakfast. Also the patient adjusted his own medication. He stopped innokana. He is maintain the other medications.   Patient continues to take lipid medication regularly. No obvious side effects from it. Generally does not miss a dose. Prior blood work results are reviewed with patient. Patient continues to work on fat intake in diet   Review of Systems No headache, no major weight loss or weight gain, no chest pain no back pain abdominal pain no change in bowel habits complete ROS otherwise negative     Objective:   Physical Exam Alert vitals stable, NAD. Blood pressure good on repeat. HEENT normal. Lungs clear. Heart regular rate and rhythm.  C diabetic foot exam  Left hand early Dupuytren's contracture no trigger finger    Assessment & Plan:  Impression type 2 diabetes suboptimum control discussed at length #2 intermittent hypoglycemic episodes. Patient unfortunately has changes on medicine. He also frequently skips breakfast. 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 Cut down morning Glucotrol to one tablet every morning. Eat breakfast always. Diet exercise discussed. Flu shot. Hepatitis C antibody, left hand chronic minimal contracture noted

## 2016-08-23 ENCOUNTER — Encounter: Payer: Self-pay | Admitting: Family Medicine

## 2016-08-23 LAB — HEPATITIS C ANTIBODY: Hep C Virus Ab: <0.1 s/co ratio (ref 0.0–0.9)

## 2016-08-25 ENCOUNTER — Other Ambulatory Visit: Payer: Self-pay | Admitting: Family Medicine

## 2016-08-31 ENCOUNTER — Other Ambulatory Visit: Payer: Self-pay | Admitting: Family Medicine

## 2016-09-16 ENCOUNTER — Other Ambulatory Visit: Payer: Self-pay | Admitting: Family Medicine

## 2016-09-19 ENCOUNTER — Telehealth: Payer: Self-pay | Admitting: Family Medicine

## 2016-09-19 ENCOUNTER — Other Ambulatory Visit: Payer: Self-pay | Admitting: *Deleted

## 2016-09-19 MED ORDER — METFORMIN HCL 500 MG PO TABS: ORAL_TABLET | ORAL | 5 refills | Status: DC

## 2016-09-19 NOTE — Telephone Encounter (Signed)
Refills sent to pharm. Pt notified on voicemail.  

## 2016-09-19 NOTE — Telephone Encounter (Signed)
Pt is requesting a refill on his metFORMIN (GLUCOPHAGE) 500 MG tablet    [facility] [place]

## 2016-09-24 ENCOUNTER — Other Ambulatory Visit: Payer: Self-pay | Admitting: Family Medicine

## 2017-02-26 ENCOUNTER — Telehealth: Payer: Self-pay | Admitting: Family Medicine

## 2017-02-26 DIAGNOSIS — E119 Type 2 diabetes mellitus without complications: Secondary | ICD-10-CM

## 2017-02-26 DIAGNOSIS — E785 Hyperlipidemia, unspecified: Secondary | ICD-10-CM

## 2017-02-26 DIAGNOSIS — Z125 Encounter for screening for malignant neoplasm of prostate: Secondary | ICD-10-CM

## 2017-02-26 DIAGNOSIS — I10 Essential (primary) hypertension: Secondary | ICD-10-CM

## 2017-02-26 NOTE — Telephone Encounter (Signed)
Pt is requesting lab orders to be sent over. Last labs per epic were: hepatitis c,a1c,hepatic, and lipid on 08/14/16.

## 2017-02-26 NOTE — Telephone Encounter (Signed)
Lip liv m7 A1c psa 

## 2017-02-27 NOTE — Telephone Encounter (Signed)
Blood work ordered in [facility]. Patient notified. 

## 2017-03-01 DIAGNOSIS — I10 Essential (primary) hypertension: Secondary | ICD-10-CM | POA: Diagnosis not present

## 2017-03-01 DIAGNOSIS — E119 Type 2 diabetes mellitus without complications: Secondary | ICD-10-CM | POA: Diagnosis not present

## 2017-03-01 DIAGNOSIS — Z125 Encounter for screening for malignant neoplasm of prostate: Secondary | ICD-10-CM | POA: Diagnosis not present

## 2017-03-01 DIAGNOSIS — E785 Hyperlipidemia, unspecified: Secondary | ICD-10-CM | POA: Diagnosis not present

## 2017-03-02 LAB — BASIC METABOLIC PANEL
BUN / CREAT RATIO: 15 (ref 9–20)
BUN: 13 mg/dL (ref 6–24)
CHLORIDE: 108 mmol/L — AB (ref 96–106)
CO2: 20 mmol/L (ref 18–29)
Calcium: 9.3 mg/dL (ref 8.7–10.2)
Creatinine, Ser: 0.84 mg/dL (ref 0.76–1.27)
GFR calc non Af Amer: 100 mL/min/{1.73_m2} (ref >59)
GFR, EST AFRICAN AMERICAN: 116 mL/min/{1.73_m2} (ref >59)
Glucose: 61 mg/dL — ABNORMAL LOW (ref 65–99)
Potassium: 4.5 mmol/L (ref 3.5–5.2)
SODIUM: 143 mmol/L (ref 134–144)

## 2017-03-02 LAB — HEPATIC FUNCTION PANEL
ALT: 25 [IU]/L (ref 0–44)
AST: 23 [IU]/L (ref 0–40)
Albumin: 4.4 g/dL (ref 3.5–5.5)
Alkaline Phosphatase: 28 [IU]/L — ABNORMAL LOW (ref 39–117)
Bilirubin Total: <0.2 mg/dL (ref 0.0–1.2)
Bilirubin, Direct: 0.16 mg/dL (ref 0.00–0.40)
Total Protein: 7.4 g/dL (ref 6.0–8.5)

## 2017-03-02 LAB — HEMOGLOBIN A1C
Est. average glucose Bld gHb Est-mCnc: 166 mg/dL
Hgb A1c MFr Bld: 7.4 % — ABNORMAL HIGH (ref 4.8–5.6)

## 2017-03-02 LAB — LIPID PANEL
CHOL/HDL RATIO: 6.9 ratio — AB (ref 0.0–5.0)
Cholesterol, Total: 132 mg/dL (ref 100–199)
HDL: 19 mg/dL — ABNORMAL LOW (ref >39)
Triglycerides: 487 mg/dL — ABNORMAL HIGH (ref 0–149)

## 2017-03-02 LAB — PSA: Prostate Specific Ag, Serum: 0.6 ng/mL (ref 0.0–4.0)

## 2017-03-09 ENCOUNTER — Ambulatory Visit (INDEPENDENT_AMBULATORY_CARE_PROVIDER_SITE_OTHER): Payer: BLUE CROSS/BLUE SHIELD | Admitting: Family Medicine

## 2017-03-09 ENCOUNTER — Encounter: Payer: Self-pay | Admitting: Family Medicine

## 2017-03-09 VITALS — BP 134/88 | Ht 67.0 in | Wt 200.0 lb

## 2017-03-09 DIAGNOSIS — E119 Type 2 diabetes mellitus without complications: Secondary | ICD-10-CM | POA: Diagnosis not present

## 2017-03-09 DIAGNOSIS — I10 Essential (primary) hypertension: Secondary | ICD-10-CM | POA: Diagnosis not present

## 2017-03-09 DIAGNOSIS — E785 Hyperlipidemia, unspecified: Secondary | ICD-10-CM | POA: Diagnosis not present

## 2017-03-09 NOTE — Progress Notes (Signed)
Subjective:    Patient ID: Corey [name], male    DOB: 11/07/1965, 51 y.o.   MRN: [id number]  Diabetes  He presents for his follow-up diabetic visit. He has type 2 diabetes mellitus. Current diabetic treatment includes oral agent (triple therapy). He is compliant with treatment all of the time. He does not see a podiatrist.Eye exam is current.  A1C on bloodwork 7.4.  Results for orders placed or performed in visit on 02/26/17  Lipid panel  Result Value Ref Range   Cholesterol, Total 132 100 - 199 mg/dL   Triglycerides 782487 (H) 0 - 149 mg/dL   HDL 19 (L) >95>39 mg/dL   VLDL Cholesterol Cal Comment 5 - 40 mg/dL   LDL Calculated Comment 0 - 99 mg/dL   Chol/HDL Ratio 6.9 (H) 0.0 - 5.0 ratio  Hepatic function panel  Result Value Ref Range   Total Protein 7.4 6.0 - 8.5 g/dL   Albumin 4.4 3.5 - 5.5 g/dL   Bilirubin Total <6.2<0.2 0.0 - 1.2 mg/dL   Bilirubin, Direct 1.300.16 0.00 - 0.40 mg/dL   Alkaline Phosphatase 28 (L) 39 - 117 IU/L   AST 23 0 - 40 IU/L   ALT 25 0 - 44 IU/L  Basic metabolic panel  Result Value Ref Range   Glucose 61 (L) 65 - 99 mg/dL   BUN 13 6 - 24 mg/dL   Creatinine, Ser 8.650.84 0.76 - 1.27 mg/dL   GFR calc non Af Amer 100 >59 mL/min/1.73   GFR calc Af Amer 116 >59 mL/min/1.73   BUN/Creatinine Ratio 15 9 - 20   Sodium 143 134 - 144 mmol/L   Potassium 4.5 3.5 - 5.2 mmol/L   Chloride 108 (H) 96 - 106 mmol/L   CO2 20 18 - 29 mmol/L   Calcium 9.3 8.7 - 10.2 mg/dL  Hemoglobin H8IA1c  Result Value Ref Range   Hgb A1c MFr Bld 7.4 (H) 4.8 - 5.6 %   Est. average glucose Bld gHb Est-mCnc 166 mg/dL  PSA  Result Value Ref Range   Prostate Specific Ag, Serum 0.6 0.0 - 4.0 ng/mL   Patient claims compliance with diabetes medication. No obvious side effects. Reports no substantial low sugar spells. Most numbers are generally in good range when checked fasting. Generally does not miss a dose of medication. Watching diabetic diet closely  Blood pressure medicine and blood pressure levels  reviewed today with patient. Compliant with blood pressure medicine. States does not miss a dose. No obvious side effects. Blood pressure generally good when checked elsewhere. Watching salt intake.   Patient continues to take lipid medication regularly. No obvious side effects from it. Generally does not miss a dose. Prior blood work results are reviewed with patient. Patient continues to work on fat intake in diet     Review of Systems    No headache, no major weight loss or weight gain, no chest pain no back pain abdominal pain no change in bowel habits complete ROS otherwise negative  Objective:   Physical Exam Alert and oriented, vitals reviewed and stable, NAD ENT-TM's and ext canals WNL bilat via otoscopic exam Soft palate, tonsils and post pharynx WNL via oropharyngeal exam Neck-symmetric, no masses; thyroid nonpalpable and nontender Pulmonary-no tachypnea or accessory muscle use; Clear without wheezes via auscultation Card--no abnrml murmurs, rhythm reg and rate WNL Carotid pulses symmetric, without bruits        Assessment & Plan:  Impression 1 type 2 diabetes control improved discussed maintain same meds #  2 hypertension good control discussed maintain same meds #3 hyperlipidemia results discussed maintain same meds compliance discussed plan diet exercise discussed. Medications refilled. Recheck in 6 months for chronic visit plus wellness

## 2017-03-20 ENCOUNTER — Other Ambulatory Visit: Payer: Self-pay | Admitting: Family Medicine

## 2017-03-22 ENCOUNTER — Telehealth: Payer: Self-pay | Admitting: Family Medicine

## 2017-03-22 NOTE — Telephone Encounter (Signed)
Spoke with patient and informed him that refills were sent in on his medication on 03/20/17. Patient verbalized understanding.

## 2017-03-22 NOTE — Telephone Encounter (Signed)
Pt is requesting a refill on fenofibrate 160 MG tablet  Pt has been out since Tues.    [facility] 

## 2017-04-17 ENCOUNTER — Other Ambulatory Visit: Payer: Self-pay | Admitting: Family Medicine

## 2017-05-09 ENCOUNTER — Other Ambulatory Visit: Payer: Self-pay | Admitting: Family Medicine

## 2017-06-17 ENCOUNTER — Other Ambulatory Visit: Payer: Self-pay | Admitting: Family Medicine

## 2017-07-12 ENCOUNTER — Ambulatory Visit (INDEPENDENT_AMBULATORY_CARE_PROVIDER_SITE_OTHER): Payer: BLUE CROSS/BLUE SHIELD | Admitting: Family Medicine

## 2017-07-12 ENCOUNTER — Emergency Department (HOSPITAL_COMMUNITY): Payer: BLUE CROSS/BLUE SHIELD

## 2017-07-12 ENCOUNTER — Other Ambulatory Visit: Payer: Self-pay

## 2017-07-12 ENCOUNTER — Inpatient Hospital Stay (HOSPITAL_COMMUNITY)
Admission: EM | Admit: 2017-07-12 | Discharge: 2017-07-14 | DRG: 282 | Disposition: A | Discharge status: Home / Self Care | Payer: BLUE CROSS/BLUE SHIELD | Attending: Cardiology | Admitting: Cardiology

## 2017-07-12 ENCOUNTER — Encounter (HOSPITAL_COMMUNITY): Payer: Self-pay | Admitting: *Deleted

## 2017-07-12 ENCOUNTER — Encounter: Payer: Self-pay | Admitting: Family Medicine

## 2017-07-12 VITALS — BP 134/90 | Temp 98.3°F | Ht 67.0 in | Wt 200.6 lb

## 2017-07-12 DIAGNOSIS — E119 Type 2 diabetes mellitus without complications: Secondary | ICD-10-CM

## 2017-07-12 DIAGNOSIS — I712 Thoracic aortic aneurysm, without rupture, unspecified: Secondary | ICD-10-CM

## 2017-07-12 DIAGNOSIS — Z833 Family history of diabetes mellitus: Secondary | ICD-10-CM

## 2017-07-12 DIAGNOSIS — I214 Non-ST elevation (NSTEMI) myocardial infarction: Secondary | ICD-10-CM | POA: Diagnosis not present

## 2017-07-12 DIAGNOSIS — I5030 Unspecified diastolic (congestive) heart failure: Secondary | ICD-10-CM | POA: Diagnosis not present

## 2017-07-12 DIAGNOSIS — R079 Chest pain, unspecified: Secondary | ICD-10-CM

## 2017-07-12 DIAGNOSIS — E781 Pure hyperglyceridemia: Secondary | ICD-10-CM | POA: Diagnosis not present

## 2017-07-12 DIAGNOSIS — I25110 Atherosclerotic heart disease of native coronary artery with unstable angina pectoris: Secondary | ICD-10-CM | POA: Diagnosis not present

## 2017-07-12 DIAGNOSIS — R748 Abnormal levels of other serum enzymes: Secondary | ICD-10-CM | POA: Diagnosis not present

## 2017-07-12 DIAGNOSIS — Z800 Family history of malignant neoplasm of digestive organs: Secondary | ICD-10-CM | POA: Diagnosis not present

## 2017-07-12 DIAGNOSIS — I10 Essential (primary) hypertension: Secondary | ICD-10-CM | POA: Diagnosis not present

## 2017-07-12 DIAGNOSIS — E785 Hyperlipidemia, unspecified: Secondary | ICD-10-CM | POA: Diagnosis present

## 2017-07-12 DIAGNOSIS — R7989 Other specified abnormal findings of blood chemistry: Secondary | ICD-10-CM | POA: Diagnosis present

## 2017-07-12 DIAGNOSIS — Z87891 Personal history of nicotine dependence: Secondary | ICD-10-CM | POA: Diagnosis not present

## 2017-07-12 DIAGNOSIS — Z8249 Family history of ischemic heart disease and other diseases of the circulatory system: Secondary | ICD-10-CM | POA: Diagnosis not present

## 2017-07-12 DIAGNOSIS — Z7984 Long term (current) use of oral hypoglycemic drugs: Secondary | ICD-10-CM | POA: Diagnosis not present

## 2017-07-12 DIAGNOSIS — I200 Unstable angina: Secondary | ICD-10-CM | POA: Diagnosis not present

## 2017-07-12 DIAGNOSIS — I4510 Unspecified right bundle-branch block: Secondary | ICD-10-CM | POA: Diagnosis present

## 2017-07-12 DIAGNOSIS — E1169 Type 2 diabetes mellitus with other specified complication: Secondary | ICD-10-CM | POA: Diagnosis not present

## 2017-07-12 DIAGNOSIS — R778 Other specified abnormalities of plasma proteins: Secondary | ICD-10-CM | POA: Diagnosis present

## 2017-07-12 DIAGNOSIS — E118 Type 2 diabetes mellitus with unspecified complications: Secondary | ICD-10-CM | POA: Diagnosis not present

## 2017-07-12 HISTORY — DX: Essential (primary) hypertension: I10

## 2017-07-12 HISTORY — DX: Type 2 diabetes mellitus without complications: E11.9

## 2017-07-12 LAB — PROTIME-INR
INR: 1.04
PROTHROMBIN TIME: 13.5 s (ref 11.4–15.2)

## 2017-07-12 LAB — BASIC METABOLIC PANEL
Anion gap: 10 (ref 5–15)
BUN: 10 mg/dL (ref 6–20)
CHLORIDE: 105 mmol/L (ref 101–111)
CO2: 27 mmol/L (ref 22–32)
CREATININE: 0.94 mg/dL (ref 0.61–1.24)
Calcium: 10.2 mg/dL (ref 8.9–10.3)
Glucose, Bld: 88 mg/dL (ref 65–99)
Potassium: 4.3 mmol/L (ref 3.5–5.1)
SODIUM: 142 mmol/L (ref 135–145)

## 2017-07-12 LAB — CBC
HCT: 40.6 % (ref 39.0–52.0)
Hemoglobin: 13.7 g/dL (ref 13.0–17.0)
MCH: 27.3 pg (ref 26.0–34.0)
MCHC: 33.7 g/dL (ref 30.0–36.0)
MCV: 80.9 fL (ref 78.0–100.0)
PLATELETS: 258 10*3/uL (ref 150–400)
RBC: 5.02 MIL/uL (ref 4.22–5.81)
RDW: 14.1 % (ref 11.5–15.5)
WBC: 4.2 10*3/uL (ref 4.0–10.5)

## 2017-07-12 LAB — TROPONIN I
Troponin I: 0.53 ng/mL (ref <0.03)
Troponin I: 0.65 ng/mL (ref <0.03)

## 2017-07-12 LAB — APTT: APTT: 25 s (ref 24–36)

## 2017-07-12 MED ORDER — HEPARIN BOLUS VIA INFUSION
4000.0000 [IU] | Freq: Once | INTRAVENOUS | Status: AC
  Administered 2017-07-12: 4000 [IU] via INTRAVENOUS

## 2017-07-12 MED ORDER — NITROGLYCERIN 0.4 MG SL SUBL
0.4000 mg | SUBLINGUAL_TABLET | Freq: Once | SUBLINGUAL | Status: AC
  Administered 2017-07-12: 0.4 mg via SUBLINGUAL
  Filled 2017-07-12: qty 1

## 2017-07-12 MED ORDER — HEPARIN (PORCINE) IN NACL 100-0.45 UNIT/ML-% IJ SOLN
1150.0000 [IU]/h | INTRAMUSCULAR | Status: DC
  Administered 2017-07-12: 1150 [IU]/h via INTRAVENOUS
  Filled 2017-07-12: qty 250

## 2017-07-12 MED ORDER — ASPIRIN 81 MG PO CHEW
324.0000 mg | CHEWABLE_TABLET | Freq: Once | ORAL | Status: AC
  Administered 2017-07-12: 324 mg via ORAL
  Filled 2017-07-12: qty 4

## 2017-07-12 NOTE — ED Notes (Signed)
CRITICAL VALUE ALERT  Critical Value:  Troponin 0.53  Date & Time Notied:  07/12/2017   Provider Notified: Dr Jacqulyn Bath  Orders Received/Actions taken: EDP going to room to assess patient

## 2017-07-12 NOTE — ED Provider Notes (Signed)
Emergency Department Provider Note   I have reviewed the triage vital signs and the nursing notes.   HISTORY  Chief Complaint Chest Pain   HPI Corey [name] is a 51 y.o. male with PMH of DM, HTN, and HLD presents to the emergency department for evaluation of right sided chest soreness that has been constant over the last 24 hours. Patient has no prior history of coronary artery disease. He was seen by his private care physician today for the chest pain and referred to the emergency department for further evaluation. No exacerbating or alleviating factors. Pain radiates slightly to the right arm. No diaphoresis or shortness of breath. No fever or chills. His father had an MI in his 48s but no other family history.    Past Medical History:  Diagnosis Date  . Diabetes mellitus without complication (HCC)    Type 2  . Hypertension   . Hypertriglyceridemia     Patient Active Problem List   Diagnosis Date Noted  . Diabetes (HCC) 02/09/2013  . Other and unspecified hyperlipidemia 02/09/2013  . Essential hypertension, benign 02/09/2013    Past Surgical History:  Procedure Laterality Date  . COLONOSCOPY N/A 10/14/2014   Procedure: COLONOSCOPY;  Surgeon: Corbin Ade, MD;  Location: [facility];  Service: Endoscopy;  Laterality: N/A;  8:30 AM  . NO PAST SURGERIES      Current Outpatient Rx  . Order #: [id number] Class: Normal  . Order #: [id number] Class: Normal  . Order #: [id number] Class: Normal  . Order #: [id number] Class: Normal  . Order #: [id number] Class: Normal  . Order #: [id number] Class: Normal  . Order #: [id number] Class: Normal    Allergies Patient has no known allergies.  Family History  Problem Relation Age of Onset  . Colon cancer Other   . Cancer Father   . Hypertension Father   . Diabetes Father   . Heart attack Father   . Colon cancer Maternal Uncle     Social History Social History  Substance Use Topics  . Smoking status: Former Smoker    Packs/day:  0.50    Years: 10.00    Types: Cigarettes    Quit date: 02/07/1993  . Smokeless tobacco: Never Used  . Alcohol use No    Review of Systems  Constitutional: No fever/chills Eyes: No visual changes. ENT: No sore throat. Cardiovascular: Positive chest pain. Respiratory: Denies shortness of breath. Gastrointestinal: No abdominal pain.  No nausea, no vomiting.  No diarrhea.  No constipation. Genitourinary: Negative for dysuria. Musculoskeletal: Negative for back pain. Skin: Negative for rash. Neurological: Negative for headaches, focal weakness or numbness.  10-point ROS otherwise negative.  ____________________________________________   PHYSICAL EXAM:  VITAL SIGNS: ED Triage Vitals  Enc Vitals Group     BP 07/12/17 1542 (!) 141/93     Pulse Rate 07/12/17 1542 74     Resp 07/12/17 1542 18     Temp 07/12/17 1542 98.4 F (36.9 C)     Temp Source 07/12/17 1542 Oral     SpO2 07/12/17 1542 97 %     Weight 07/12/17 1539 200 lb (90.7 kg)     Height 07/12/17 1539  (1.702 m)     Pain Score 07/12/17 1539 1   Constitutional: Alert and oriented. Well appearing and in no acute distress. Eyes: Conjunctivae are normal.  Head: Atraumatic. Nose: No congestion/rhinnorhea. Mouth/Throat: Mucous membranes are moist. Neck: No stridor.  Cardiovascular: Normal rate, regular rhythm. Good peripheral circulation. Grossly normal heart sounds.  Respiratory: Normal respiratory effort.  No retractions. Lungs CTAB. Gastrointestinal: Soft and nontender. No distention.  Musculoskeletal: No lower extremity tenderness nor edema. No gross deformities of extremities. Neurologic:  Normal speech and language. No gross focal neurologic deficits are appreciated.  Skin:  Skin is warm, dry and intact. No rash noted.  ____________________________________________   LABS (all labs ordered are listed, but only abnormal results are displayed)  Labs Reviewed  TROPONIN I - Abnormal; Notable for the  following:       Result Value   Troponin I 0.53 (*)    All other components within normal limits  BASIC METABOLIC PANEL  CBC   ____________________________________________  EKG   EKG Interpretation  Date/Time:  Thursday July 12 2017 15:41:27 EDT Ventricular Rate:  77 PR Interval:  140 QRS Duration: 130 QT Interval:  410 QTC Calculation: 463 R Axis:   54 Text Interpretation:  Normal sinus rhythm Right bundle branch block Abnormal ECG No STEMI. No old tracing for comparison.  Confirmed by Alona Bene [phone]) on 07/12/2017 3:57:14 PM       ____________________________________________  RADIOLOGY  Dg Chest 2 View  Result Date: 07/12/2017 CLINICAL DATA:  Chest pain since last night EXAM: CHEST  2 VIEW COMPARISON:  None. FINDINGS: Heart size and mediastinal contours are within normal limits. Lungs are clear. No pleural effusion or pneumothorax seen. Elevation of the right hemidiaphragm, of uncertain acuity. Osseous and soft tissue structures about the chest are otherwise unremarkable. IMPRESSION: No active cardiopulmonary disease. No evidence of pneumonia or pulmonary edema. Electronically Signed   By: Bary Richard M.D.   On: 07/12/2017 15:56    ____________________________________________   PROCEDURES  Procedure(s) performed:   Procedures  CRITICAL CARE Performed by: Maia Plan Total critical care time: 50 minutes Critical care time was exclusive of separately billable procedures and treating other patients. Critical care was necessary to treat or prevent imminent or life-threatening deterioration. Critical care was time spent personally by me on the following activities: development of treatment plan with patient and/or surrogate as well as nursing, discussions with consultants, evaluation of patient's response to treatment, examination of patient, obtaining history from patient or surrogate, ordering and performing treatments and interventions, ordering and review  of laboratory studies, ordering and review of radiographic studies, pulse oximetry and re-evaluation of patient's condition.  Alona Bene, MD Emergency Medicine  ____________________________________________   INITIAL IMPRESSION / ASSESSMENT AND PLAN / ED COURSE  Pertinent labs & imaging results that were available during my care of the patient were reviewed by me and considered in my medical decision making (see chart for details).  Patient resents to the emergency department for evaluation of right-sided chest soreness radiating to the right arm. Troponin from triage is elevated to 0.53. Patient continues to have constant, active CP for the last 24 hours. No old EKG for comparison but no STEMI. Starting with ASA, SL nitro, and heparin drip. Will discuss with Cardiology on call.   Discussed patient's case with Cardiology, Dr. Tenny Craw to request admission. Patient and family (if present) updated with plan. Care transferred to Cardiology service.  I reviewed all nursing notes, vitals, pertinent old records, EKGs, labs, imaging (as available).  06:50 PM Patient is currently chest pain free after single SL nitro.  11:38 PM Paging Cardiology to place orders. Second troponin only slightly up-trending. Patient on heparin. Continues to be chest pain free.  ____________________________________________  FINAL CLINICAL IMPRESSION(S) / ED DIAGNOSES  Final diagnoses:  NSTEMI (non-ST elevated myocardial infarction) (  HCC)     MEDICATIONS GIVEN DURING THIS VISIT:  Medications  nitroGLYCERIN (NITROSTAT) SL tablet 0.4 mg (not administered)  aspirin chewable tablet 324 mg (not administered)     NEW OUTPATIENT MEDICATIONS STARTED DURING THIS VISIT:  None   Note:  This document was prepared using Dragon voice recognition software and may include unintentional dictation errors.  Alona Bene, MD Emergency Medicine    Long, Arlyss Repress, MD 07/12/17 [phone]

## 2017-07-12 NOTE — Progress Notes (Signed)
   Subjective:    Patient ID: Corey [name], male    DOB: [birth year], 51 y.o.   MRN: [id number]  Chest Pain   This is a new problem. Episode onset: 18 hours ago. Pain location: right side of chest. Pertinent negatives include no cough, fever, headaches or shortness of breath. The pain is aggravated by nothing. He has tried NSAIDs (feels better when stretching) for the symptoms. The treatment provided no relief.   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 but he does not feel that that work was hard and does not feel that it is causing his chest discomfort currently  He does state that he had onset of this last night it is 8 throughout the day he states it is not sore to the touch if he does extend his arm upward into the right it seems to get a little bit better he does relate that the discomfort radiates into his elbow  This patient has multiple risk factors including being a former smoker years ago, a diabetic under fair control and significant hyperlipidemia and hypertension he also has family history of stroke    Review of Systems  Constitutional: Negative for activity change, fatigue and fever.  HENT: Negative for congestion and sinus pressure.   Respiratory: Negative for cough and shortness of breath.   Cardiovascular: Positive for chest pain. Negative for leg swelling.  Neurological: Negative for headaches.       Objective:   Physical Exam  Constitutional: He appears well-nourished. No distress.  Cardiovascular: Normal rate, regular rhythm and normal heart sounds.   No murmur heard. Pulmonary/Chest: Effort normal and breath sounds normal. No respiratory distress.  Musculoskeletal: He exhibits no edema.  Lymphadenopathy:    He has no cervical adenopathy.  Neurological: He is alert.  Psychiatric: His behavior is normal.  Vitals reviewed.  EKG from 2015 was reviewed EKG from today was reviewed Does  show right bundle branch block pattern as well as possible left ventricular strain with ST segments.  Given that his pain is not reproducible with pressure or movement it is hard to assign his chest pain to musculoskeletal cause      Assessment & Plan:  Chest discomfort-atypical-multiple risk factors for heart disease-I cannot rule out the possibility of underlying coronary artery disease. EKG does show some changes. I believe this patient warrants cardiac enzyme rule out. Will refer to ER. More than likely will need cardiology rule out as well patient understands what we are discussing

## 2017-07-12 NOTE — ED Triage Notes (Signed)
Right side chest pain onset last night, sent from Dr Fletcher Anon office

## 2017-07-12 NOTE — Progress Notes (Signed)
ANTICOAGULATION CONSULT NOTE - Initial Consult  Pharmacy Consult for Heparin Indication: chest pain/ACS  No Known Allergies  Patient Measurements: Height:  (170.2 cm) Weight: 200 lb (90.7 kg) IBW/kg (Calculated) : 66.1 HEPARIN DW (KG): 85.1   Vital Signs: Temp: 98.4 F (36.9 C) (09/20 1542) Temp Source: Oral (09/20 1542) BP: 174/111 (09/20 1815) Pulse Rate: 77 (09/20 1815)  Labs:  Recent Labs  07/12/17 1729  HGB 13.7  HCT 40.6  PLT 258  CREATININE 0.94  TROPONINI 0.53*    Estimated Creatinine Clearance: 97.6 mL/min (by C-G formula based on SCr of 0.94 mg/dL).  Medical History: Past Medical History:  Diagnosis Date  . Diabetes mellitus without complication (HCC)    Type 2  . Hypertension   . Hypertriglyceridemia    Medications:   (Not in a hospital admission)  Home med list reviewed, not on oral anticoagulant.  Assessment: Okay for Protocol, baseline anticoagulant labs pending.  No bleeding noted.  Goal of Therapy:  Heparin level 0.3-0.7 units/ml Monitor platelets by anticoagulation protocol: Yes   Plan:  Give 4000 units bolus x 1 Start heparin infusion at 1150 units/hr Check anti-Xa level in 6-8 hours and daily while on heparin Continue to monitor H&H and platelets   Mady Gemma 07/12/2017,6:46 PM

## 2017-07-13 ENCOUNTER — Encounter: Payer: Self-pay | Admitting: Family Medicine

## 2017-07-13 ENCOUNTER — Encounter (HOSPITAL_COMMUNITY)
Admission: EM | Disposition: A | Discharge status: Home / Self Care | Payer: Self-pay | Source: Home / Self Care | Attending: Cardiology

## 2017-07-13 ENCOUNTER — Encounter (HOSPITAL_COMMUNITY): Payer: Self-pay | Admitting: *Deleted

## 2017-07-13 DIAGNOSIS — I200 Unstable angina: Secondary | ICD-10-CM

## 2017-07-13 DIAGNOSIS — E118 Type 2 diabetes mellitus with unspecified complications: Secondary | ICD-10-CM

## 2017-07-13 DIAGNOSIS — I5030 Unspecified diastolic (congestive) heart failure: Secondary | ICD-10-CM | POA: Diagnosis not present

## 2017-07-13 DIAGNOSIS — Z7984 Long term (current) use of oral hypoglycemic drugs: Secondary | ICD-10-CM | POA: Diagnosis not present

## 2017-07-13 DIAGNOSIS — I10 Essential (primary) hypertension: Secondary | ICD-10-CM

## 2017-07-13 DIAGNOSIS — R7989 Other specified abnormal findings of blood chemistry: Secondary | ICD-10-CM

## 2017-07-13 DIAGNOSIS — Z8249 Family history of ischemic heart disease and other diseases of the circulatory system: Secondary | ICD-10-CM | POA: Diagnosis not present

## 2017-07-13 DIAGNOSIS — Z800 Family history of malignant neoplasm of digestive organs: Secondary | ICD-10-CM | POA: Diagnosis not present

## 2017-07-13 DIAGNOSIS — E785 Hyperlipidemia, unspecified: Secondary | ICD-10-CM | POA: Diagnosis not present

## 2017-07-13 DIAGNOSIS — I214 Non-ST elevation (NSTEMI) myocardial infarction: Secondary | ICD-10-CM | POA: Diagnosis not present

## 2017-07-13 DIAGNOSIS — E1169 Type 2 diabetes mellitus with other specified complication: Secondary | ICD-10-CM

## 2017-07-13 DIAGNOSIS — I712 Thoracic aortic aneurysm, without rupture: Secondary | ICD-10-CM | POA: Diagnosis not present

## 2017-07-13 DIAGNOSIS — E781 Pure hyperglyceridemia: Secondary | ICD-10-CM | POA: Diagnosis present

## 2017-07-13 DIAGNOSIS — Z833 Family history of diabetes mellitus: Secondary | ICD-10-CM | POA: Diagnosis not present

## 2017-07-13 DIAGNOSIS — Z87891 Personal history of nicotine dependence: Secondary | ICD-10-CM | POA: Diagnosis not present

## 2017-07-13 DIAGNOSIS — I25110 Atherosclerotic heart disease of native coronary artery with unstable angina pectoris: Secondary | ICD-10-CM | POA: Diagnosis not present

## 2017-07-13 DIAGNOSIS — I4510 Unspecified right bundle-branch block: Secondary | ICD-10-CM | POA: Diagnosis present

## 2017-07-13 DIAGNOSIS — R748 Abnormal levels of other serum enzymes: Secondary | ICD-10-CM | POA: Diagnosis not present

## 2017-07-13 DIAGNOSIS — E119 Type 2 diabetes mellitus without complications: Secondary | ICD-10-CM | POA: Diagnosis present

## 2017-07-13 DIAGNOSIS — R778 Other specified abnormalities of plasma proteins: Secondary | ICD-10-CM | POA: Diagnosis present

## 2017-07-13 HISTORY — PX: LEFT HEART CATH AND CORONARY ANGIOGRAPHY: CATH118249

## 2017-07-13 LAB — HEPARIN LEVEL (UNFRACTIONATED)
HEPARIN UNFRACTIONATED: 0.36 [IU]/mL (ref 0.30–0.70)
HEPARIN UNFRACTIONATED: 0.3 [IU]/mL (ref 0.30–0.70)

## 2017-07-13 LAB — TROPONIN I
TROPONIN I: 0.67 ng/mL — AB (ref <0.03)
TROPONIN I: 0.67 ng/mL — AB (ref <0.03)
Troponin I: 0.59 ng/mL (ref <0.03)
Troponin I: 0.45 ng/mL (ref <0.03)

## 2017-07-13 LAB — CBC
HCT: 38.5 % — ABNORMAL LOW (ref 39.0–52.0)
HEMOGLOBIN: 13.2 g/dL (ref 13.0–17.0)
MCH: 27.6 pg (ref 26.0–34.0)
MCHC: 34.3 g/dL (ref 30.0–36.0)
MCV: 80.4 fL (ref 78.0–100.0)
PLATELETS: 238 10*3/uL (ref 150–400)
RBC: 4.79 MIL/uL (ref 4.22–5.81)
RDW: 13.9 % (ref 11.5–15.5)
WBC: 3.8 10*3/uL — ABNORMAL LOW (ref 4.0–10.5)

## 2017-07-13 LAB — HEMOGLOBIN A1C
Hgb A1c MFr Bld: 7.8 % — ABNORMAL HIGH (ref 4.8–5.6)
Mean Plasma Glucose: 177.16 mg/dL

## 2017-07-13 LAB — GLUCOSE, CAPILLARY
GLUCOSE-CAPILLARY: 124 mg/dL — AB (ref 65–99)
GLUCOSE-CAPILLARY: 138 mg/dL — AB (ref 65–99)
Glucose-Capillary: 140 mg/dL — ABNORMAL HIGH (ref 65–99)
Glucose-Capillary: 137 mg/dL — ABNORMAL HIGH (ref 65–99)

## 2017-07-13 LAB — MRSA PCR SCREENING: MRSA by PCR: NEGATIVE

## 2017-07-13 LAB — D-DIMER, QUANTITATIVE: D-Dimer, Quant: 0.32 ug/mL-FEU (ref 0.00–0.50)

## 2017-07-13 SURGERY — LEFT HEART CATH AND CORONARY ANGIOGRAPHY
Anesthesia: LOCAL

## 2017-07-13 MED ORDER — ASPIRIN 325 MG PO TABS
325.0000 mg | ORAL_TABLET | Freq: Every day | ORAL | Status: DC
  Administered 2017-07-13 – 2017-07-14 (×2): 325 mg via ORAL
  Filled 2017-07-13 (×2): qty 1

## 2017-07-13 MED ORDER — FENOFIBRATE 160 MG PO TABS
160.0000 mg | ORAL_TABLET | Freq: Every day | ORAL | Status: DC
  Administered 2017-07-13 – 2017-07-14 (×2): 160 mg via ORAL
  Filled 2017-07-13 (×5): qty 1

## 2017-07-13 MED ORDER — FENTANYL CITRATE (PF) 100 MCG/2ML IJ SOLN
INTRAMUSCULAR | Status: AC
  Filled 2017-07-13: qty 2

## 2017-07-13 MED ORDER — INSULIN ASPART 100 UNIT/ML ~~LOC~~ SOLN: 0.0000 [IU] | Freq: Three times a day (TID) | SUBCUTANEOUS | Status: DC

## 2017-07-13 MED ORDER — MORPHINE SULFATE (PF) 2 MG/ML IV SOLN: 1.0000 mg | INTRAVENOUS | Status: DC | PRN

## 2017-07-13 MED ORDER — MIDAZOLAM HCL 2 MG/2ML IJ SOLN
INTRAMUSCULAR | Status: AC
  Filled 2017-07-13: qty 2

## 2017-07-13 MED ORDER — VERAPAMIL HCL 2.5 MG/ML IV SOLN
INTRAVENOUS | Status: DC | PRN
  Administered 2017-07-13: 10 mL via INTRA_ARTERIAL

## 2017-07-13 MED ORDER — SODIUM CHLORIDE 0.9% FLUSH
3.0000 mL | Freq: Two times a day (BID) | INTRAVENOUS | Status: DC
  Administered 2017-07-14: 3 mL via INTRAVENOUS

## 2017-07-13 MED ORDER — ASPIRIN 81 MG PO CHEW: 81.0000 mg | CHEWABLE_TABLET | ORAL | Status: DC

## 2017-07-13 MED ORDER — LIDOCAINE HCL (PF) 1 % IJ SOLN
INTRAMUSCULAR | Status: DC | PRN
  Administered 2017-07-13: 2 mL

## 2017-07-13 MED ORDER — SODIUM CHLORIDE 0.9 % IV SOLN
250.0000 mL | INTRAVENOUS | Status: DC | PRN
Start: 2017-07-13 — End: 2017-07-14

## 2017-07-13 MED ORDER — ATORVASTATIN CALCIUM 10 MG PO TABS
10.0000 mg | ORAL_TABLET | Freq: Every day | ORAL | Status: DC
Start: 2017-07-13 — End: 2017-07-14
  Administered 2017-07-13 – 2017-07-14 (×2): 10 mg via ORAL
  Filled 2017-07-13 (×2): qty 1

## 2017-07-13 MED ORDER — IOPAMIDOL (ISOVUE-370) INJECTION 76%
INTRAVENOUS | Status: DC | PRN
  Administered 2017-07-13: 90 mL via INTRA_ARTERIAL

## 2017-07-13 MED ORDER — ONDANSETRON HCL 4 MG PO TABS: 4.0000 mg | ORAL_TABLET | Freq: Four times a day (QID) | ORAL | Status: DC | PRN

## 2017-07-13 MED ORDER — FENTANYL CITRATE (PF) 100 MCG/2ML IJ SOLN
INTRAMUSCULAR | Status: DC | PRN
  Administered 2017-07-13: 50 ug via INTRAVENOUS

## 2017-07-13 MED ORDER — PNEUMOCOCCAL VAC POLYVALENT 25 MCG/0.5ML IJ INJ: 0.5000 mL | INJECTION | INTRAMUSCULAR | Status: DC

## 2017-07-13 MED ORDER — METOPROLOL TARTRATE 25 MG PO TABS
25.0000 mg | ORAL_TABLET | Freq: Two times a day (BID) | ORAL | Status: DC
  Administered 2017-07-13 – 2017-07-14 (×3): 25 mg via ORAL
  Filled 2017-07-13 (×3): qty 1

## 2017-07-13 MED ORDER — IOPAMIDOL (ISOVUE-370) INJECTION 76%
INTRAVENOUS | Status: AC
  Filled 2017-07-13: qty 100

## 2017-07-13 MED ORDER — INFLUENZA VAC SPLIT QUAD 0.5 ML IM SUSY: 0.5000 mL | PREFILLED_SYRINGE | INTRAMUSCULAR | Status: DC

## 2017-07-13 MED ORDER — ONDANSETRON HCL 4 MG/2ML IJ SOLN: 4.0000 mg | Freq: Four times a day (QID) | INTRAMUSCULAR | Status: DC | PRN

## 2017-07-13 MED ORDER — SODIUM CHLORIDE 0.9 % WEIGHT BASED INFUSION: 1.0000 mL/kg/h | INTRAVENOUS | Status: DC

## 2017-07-13 MED ORDER — MIDAZOLAM HCL 2 MG/2ML IJ SOLN
INTRAMUSCULAR | Status: DC | PRN
  Administered 2017-07-13: 2 mg via INTRAVENOUS

## 2017-07-13 MED ORDER — LOSARTAN POTASSIUM 50 MG PO TABS
50.0000 mg | ORAL_TABLET | Freq: Every day | ORAL | Status: DC
  Administered 2017-07-13 – 2017-07-14 (×2): 50 mg via ORAL
  Filled 2017-07-13 (×2): qty 1

## 2017-07-13 MED ORDER — SODIUM CHLORIDE 0.9 % IV SOLN
INTRAVENOUS | Status: AC
  Administered 2017-07-13: 19:00:00 via INTRAVENOUS

## 2017-07-13 MED ORDER — HEPARIN (PORCINE) IN NACL 100-0.45 UNIT/ML-% IJ SOLN
1500.0000 [IU]/h | INTRAMUSCULAR | Status: DC
  Administered 2017-07-13: 1200 [IU]/h via INTRAVENOUS
  Filled 2017-07-13: qty 250

## 2017-07-13 MED ORDER — ACETAMINOPHEN 325 MG PO TABS: 650.0000 mg | ORAL_TABLET | ORAL | Status: DC | PRN

## 2017-07-13 MED ORDER — HEPARIN SODIUM (PORCINE) 1000 UNIT/ML IJ SOLN
INTRAMUSCULAR | Status: DC | PRN
  Administered 2017-07-13: 4500 [IU] via INTRAVENOUS

## 2017-07-13 MED ORDER — SODIUM CHLORIDE 0.9 % IV SOLN: 250.0000 mL | INTRAVENOUS | Status: DC | PRN

## 2017-07-13 MED ORDER — SODIUM CHLORIDE 0.9% FLUSH: 3.0000 mL | INTRAVENOUS | Status: DC | PRN

## 2017-07-13 MED ORDER — HEPARIN (PORCINE) IN NACL 2-0.9 UNIT/ML-% IJ SOLN
INTRAMUSCULAR | Status: AC | PRN
  Administered 2017-07-13: 1000 mL

## 2017-07-13 MED ORDER — LIDOCAINE HCL 2 % IJ SOLN
INTRAMUSCULAR | Status: AC
  Filled 2017-07-13: qty 10

## 2017-07-13 MED ORDER — SODIUM CHLORIDE 0.9 % IV SOLN: INTRAVENOUS | Status: DC

## 2017-07-13 MED ORDER — ACETAMINOPHEN 325 MG PO TABS: 650.0000 mg | ORAL_TABLET | Freq: Four times a day (QID) | ORAL | Status: DC | PRN

## 2017-07-13 MED ORDER — VERAPAMIL HCL 2.5 MG/ML IV SOLN
INTRAVENOUS | Status: AC
  Filled 2017-07-13: qty 2

## 2017-07-13 MED ORDER — SODIUM CHLORIDE 0.9 % WEIGHT BASED INFUSION: 3.0000 mL/kg/h | INTRAVENOUS | Status: DC

## 2017-07-13 MED ORDER — HEPARIN (PORCINE) IN NACL 2-0.9 UNIT/ML-% IJ SOLN
INTRAMUSCULAR | Status: AC
  Filled 2017-07-13: qty 1000

## 2017-07-13 SURGICAL SUPPLY — 12 items
CATH 5FR JL3.5 JR4 ANG PIG MP (CATHETERS) ×2 IMPLANT
COVER PRB 48X5XTLSCP FOLD TPE (BAG) IMPLANT
COVER PROBE 5X48 (BAG)
DEVICE RAD COMP TR BAND LRG (VASCULAR PRODUCTS) ×2 IMPLANT
GLIDESHEATH SLEND SS 6F .021 (SHEATH) ×2 IMPLANT
GUIDEWIRE INQWIRE 1.5J.035X260 (WIRE) ×1 IMPLANT
INQWIRE 1.5J .035X260CM (WIRE) ×2
KIT HEART LEFT (KITS) ×2 IMPLANT
PACK CARDIAC CATHETERIZATION (CUSTOM PROCEDURE TRAY) ×2 IMPLANT
SYR MEDRAD MARK V 150ML (SYRINGE) ×2 IMPLANT
TRANSDUCER W/STOPCOCK (MISCELLANEOUS) ×2 IMPLANT
TUBING CIL FLEX 10 FLL-RA (TUBING) ×2 IMPLANT

## 2017-07-13 NOTE — ED Notes (Signed)
Per Thurston Hole at [facility], patient Heparin level is therapeutic and rate does not need to be adjusted.

## 2017-07-13 NOTE — Progress Notes (Signed)
TR band removed. No complication.  Colleen Can, RN

## 2017-07-13 NOTE — Progress Notes (Signed)
ANTICOAGULATION CONSULT NOTE - Follow Up Consult  Pharmacy Consult for Heparin Indication: Chest pain/ACS  No Known Allergies  Patient Measurements: Height:  (170.2 cm) Weight: 200 lb (90.7 kg) IBW/kg (Calculated) : 66.1 Heparin Dosing Weight: 85.1 kg  Vital Signs: BP: 150/94 (09/21 0330) Pulse Rate: 75 (09/21 0330)  Labs:  Recent Labs  07/12/17 1729 07/12/17 1738 07/12/17 2020 07/12/17 2352 07/13/17 0220  HGB 13.7  --   --   --  13.2  HCT 40.6  --   --   --  38.5*  PLT 258  --   --   --  238  APTT  --  25  --   --   --   LABPROT  --  13.5  --   --   --   INR  --  1.04  --   --   --   HEPARINUNFRC  --   --   --   --  0.36  CREATININE 0.94  --   --   --   --   TROPONINI 0.53*  --  0.65* 0.67* 0.67*    Estimated Creatinine Clearance: 97.6 mL/min (by C-G formula based on SCr of 0.94 mg/dL).   Medications:  Heparin infusing at 1150 units/hr  Assessment: Anti-Xa level at 0.36 units/ml is within the therapeutic goal. Pt is tolerating anticoagulation without problems and is awaiting transfer to [facility] for cardiology management.  Goal of Therapy:  Heparin level 0.3-0.7 units/ml Monitor platelets by anticoagulation protocol: Yes  Plan:  Continue heparin infusion at 1150 units/hr Monitor anti-Xa level daily while on heparin Monitor H&H and platelets  Arelia Sneddon 07/13/2017,4:17 AM

## 2017-07-13 NOTE — Consult Note (Signed)
Cardiology Consultation:   Patient ID: Corey [name]; [id number]; Feb 12, 1966   Admit date: 07/12/2017 Date of Consult: 07/13/2017  Primary Care Provider: Mikey Kirschner, MD Consulting Cardiologist: Dr Satira Sark    Patient Profile:   Corey [name] is a 51 y.o. male with a history of hypertension, type II diabetes, and hypertriglyceridemia who is being seen today for the evaluation of chest pain and abnormal troponin levels at the request of Dr. Darrick Meigs and Dr Wolfgang Phoenix.  History of Present Illness:   Corey [name] is followed as OP by Dr. Wolfgang Phoenix who saw him in his office yesterday 07/12/2017 with complaints of chest pain. Review of Dr. Lance Sell note reveals that the patient began right sided chest discomfort 18 hours prior to the appointment (was seen at 2 pm) described as tightness. Due to CVRF's and symptoms he was sent to ER for further evaluation.   He has no prior cardiac history and and never had chest discomfort in the past. He has been medically compliant.   On arrival to ER, BP 136/97, HR 73, O2 sat 98%. He has BP elevation after he arrived one hour later with BP of 174-11. EKG demonstrated SR, RBBB, HR 77 bpm. Troponin was found to be elevated,  Initial 0.53, 0.65, and 0.67 with follow up levels of 0.67, 0.67, and 0.67 respectively. WBC were noted to be 3.9. CXR did not reveal CHF, or pneumonia. He was treated with NTG, ASA, and started on heparin gtt. He is on the list for transfer to [facility] when bed available.   He is currently pain free and comfortable.   Past Medical History:  Diagnosis Date  . Essential hypertension   . Hypertriglyceridemia   . Type 2 diabetes mellitus ([place])     Past Surgical History:  Procedure Laterality Date  . COLONOSCOPY N/A 10/14/2014   Procedure: COLONOSCOPY;  Surgeon: Daneil Dolin, MD;  Location: [facility];  Service: Endoscopy;  Laterality: N/A;  8:30 AM  . NO PAST SURGERIES       Home Medications:  Prior to Admission medications     Medication Sig Start Date End Date Taking? Authorizing Provider  atorvastatin (LIPITOR) 10 MG tablet TAKE 1 TABLET BY MOUTH ONCE DAILY 06/19/17  Yes Mikey Kirschner, MD  canagliflozin (INVOKANA) 300 MG TABS tablet TAKE 1 TABLET BY MOUTH EVERY DAY BEFORE BREAKFAST 08/22/16  Yes Mikey Kirschner, MD  fenofibrate 160 MG tablet TAKE 1 TABLET BY MOUTH ONCE DAILY 03/20/17  Yes Mikey Kirschner, MD  glipiZIDE (GLUCOTROL) 5 MG tablet Take 1 tablet in the am and 2 tablets in the pm 08/22/16  Yes Mikey Kirschner, MD  losartan (COZAAR) 50 MG tablet TAKE 1 TABLET BY MOUTH ONCE DAILY 04/17/17  Yes Mikey Kirschner, MD  metFORMIN (GLUCOPHAGE) 500 MG tablet TAKE 2 TABLETS BY MOUTH ONCE DAILY IN THE MORNING, 1 TABLET AT NOON, AND  2 TABLETS  IN THE EVENING 05/10/17  Yes Mikey Kirschner, MD  naproxen sodium (ANAPROX) 220 MG tablet Take 220 mg by mouth daily as needed (pain).   Yes [provider]  blood glucose meter kit and supplies KIT Dispense based on patient and insurance preference. Test blood sugar once daily. Type 2 diabetes. E11.9 04/27/15   Mikey Kirschner, MD    Inpatient Medications: Scheduled Meds: . aspirin  325 mg Oral Daily  . atorvastatin  10 mg Oral Daily  . fenofibrate  160 mg Oral Daily  . [START ON 07/14/2017]  Influenza vac split quadrivalent PF  0.5 mL Intramuscular Tomorrow-1000  . insulin aspart  0-9 Units Subcutaneous TID WC  . losartan  50 mg Oral Daily  . metoprolol tartrate  25 mg Oral BID  . [START ON 07/14/2017] pneumococcal 23 valent vaccine  0.5 mL Intramuscular Tomorrow-1000   Continuous Infusions: . sodium chloride    . heparin 1,150 Units/hr (07/12/17 1928)   PRN Meds: acetaminophen, morphine injection, ondansetron **OR** ondansetron (ZOFRAN) IV  Allergies:   No Known Allergies  Social History:   Social History   Social History  . Marital status: Married    Spouse name: N/A  . Number of children: N/A  . Years of education: N/A   Occupational  History  . Not on file.   Social History Main Topics  . Smoking status: Former Smoker    Packs/day: 0.50    Years: 10.00    Types: Cigarettes    Quit date: 02/07/1993  . Smokeless tobacco: Never Used  . Alcohol use Yes     Comment: occasionally  . Drug use: No  . Sexual activity: Yes   Other Topics Concern  . Not on file   Social History Narrative  . No narrative on file    Family History:    Family History  Problem Relation Age of Onset  . Colon cancer Other   . Cancer Father   . Hypertension Father   . Diabetes Father   . Heart attack Father   . Colon cancer Maternal Uncle      ROS:  Please see the history of present illness.  ROS  No recent fevers or chills, no cough, no bleeding diathesis, no stool changes. All other ROS reviewed and negative.     Physical Exam/Data:   Vitals:   07/13/17 0650 07/13/17 0700 07/13/17 0738 07/13/17 0800  BP:  (!) 141/89  (!) 141/103  Pulse:  78  80  Resp:  18  (!) 21  Temp: 99 F (37.2 C)  98.7 F (37.1 C)   TempSrc: Oral  Oral   SpO2:  98%  97%  Weight: 195 lb 15.8 oz (88.9 kg)     Height: 5' 7" (1.702 m)      No intake or output data in the 24 hours ending 07/13/17 0930 Filed Weights   07/12/17 1539 07/13/17 0650  Weight: 200 lb (90.7 kg) 195 lb 15.8 oz (88.9 kg)   Body mass index is 30.7 kg/m.   General:  Well nourished, well developed, in no acute distress HEENT: normal Lymph: no adenopathy Neck: no JVD Endocrine:  No thryomegaly Vascular: No carotid bruits; FA pulses 2+ bilaterally without bruits  Cardiac:  normal S1, S2; RRR; no murmur  Lungs:  clear to auscultation bilaterally, no wheezing, rhonchi or rales  Abd: soft, nontender, no hepatomegaly  Ext: no edema Musculoskeletal:  No deformities, BUE and BLE strength normal and equal Skin: warm and dry  Neuro:  CNs 2-12 intact, no focal abnormalities noted Psych:  Normal affect   EKG:  The EKG from 07/12/2017 shows sinus rhythm with right bundle branch  block, personally reviewed.  Telemetry:  Telemetry was personally reviewed and demonstrates sinus rhythm.  Laboratory Data:  Chemistry  Recent Labs Lab 07/12/17 1729  NA 142  K 4.3  CL 105  CO2 27  GLUCOSE 88  BUN 10  CREATININE 0.94  CALCIUM 10.2  GFRNONAA >60  GFRAA >60  ANIONGAP 10     Hematology  Recent Labs Lab 07/12/17   1729 07/13/17 0220  WBC 4.2 3.8*  RBC 5.02 4.79  HGB 13.7 13.2  HCT 40.6 38.5*  MCV 80.9 80.4  MCH 27.3 27.6  MCHC 33.7 34.3  RDW 14.1 13.9  PLT 258 238   Cardiac Enzymes  Recent Labs Lab 07/12/17 1729 07/12/17 2020 07/12/17 2352 07/13/17 0220 07/13/17 0659  TROPONINI 0.53* 0.65* 0.67* 0.67* 0.59*    Radiology/Studies:  Dg Chest 2 View  Result Date: 07/12/2017 CLINICAL DATA:  Chest pain since last night EXAM: CHEST  2 VIEW COMPARISON:  None. FINDINGS: Heart size and mediastinal contours are within normal limits. Lungs are clear. No pleural effusion or pneumothorax seen. Elevation of the right hemidiaphragm, of uncertain acuity. Osseous and soft tissue structures about the chest are otherwise unremarkable. IMPRESSION: No active cardiopulmonary disease. No evidence of pneumonia or pulmonary edema. Electronically Signed   By: Stan  Maynard M.D.   On: 07/12/2017 15:56    Assessment and Plan:   1. NSTEMI: Patient with intermittent chest discomfort at this point. EKG did not reveal acute ST-T wave abnormalities. He has RBBB unclear if this is new finding as no prior EKG's available for comparison. I have discussed plan for cardiac catheterization in this setting and gave a detailed explanation of the procedure. The patient understands that risks include but are not limited to stroke (1 in 1000), death (1 in 1000), kidney failure [usually temporary] (1 in 500), bleeding (1 in 200), allergic reaction [possibly serious] (1 in 200), and agrees to proceed. Questions have been answered.   He is on the cath board at [facility] to be done today. [facility] is aware. He will be given clear liquids to drink as he has not had food since yesterday am, with his medications. Continue heparin until on call to cath lab.  2. Hypertension:  BP is not optimal for diabetic patient with potential CAD. Remains on metoprolol 25 mg BID, and is on losartan 50 mg daily. Will increase to 75 mg daily. Creatinine 0.94.   3. Diabetes: Per PCP team. Currently controlled on sliding scale insulin.    Signed, Kathryn Lawrence DNP, ANP, AACC.  07/13/2017 9:30 AM    Attending note:  Patient seen and examined. I reviewed his records and updated the chart. I also modified above note by Ms. Lawrence DNP. Mr. Bruster presents via office evaluation by Dr. Luking yesterday with recent onset prolonged chest discomfort described as a right sided tightness, eventually shifting to the left side. He does not endorse any specific precipitant for the symptoms, has had no chest pain like this in the past. He reports compliance with his medications for hypertension and type 2 diabetes mellitus. Did not have any associated palpitations or syncope, mildly short of breath and fatigue. Reports no recent change in stamina, no orthopnea or PND.  On examination this morning he appears comfortable. Reports having intermittent chest discomfort although better than at presentation. Recent systolic blood pressure 1:30 with heart rate in the 70s in sinus rhythm by telemetry. Lungs are clear without labored breathing. Cardiac exam reveals RRR without gallop or rub. Distal pulses are full. Lab work shows peak troponin I 0.76, creatinine 0.94, potassium 4.3, hemoglobin 13.2, platelets 238. Chest x-ray shows no acute findings. ECG shows sinus rhythm with right bundle branch block of uncertain duration.  Patient presents with unstable angina and enzymatic evidence of NSTEMI. He is on IV heparin, aspirin, Lipitor, Cozaar, and Lopressor. Plan is for transfer to the cardiac catheterization lab at [facility] for diagnostic cardiac catheterization today. Risks and anticipated benefits have been discussed and he is in agreement to proceed.  Samuel G. McDowell, M.D., F.A.C.C.     

## 2017-07-13 NOTE — Progress Notes (Signed)
ANTICOAGULATION CONSULT NOTE - Follow Up Consult  Pharmacy Consult for Heparin Indication: Chest pain/ACS  No Known Allergies  Patient Measurements: Height:  (170.2 cm) Weight: 195 lb 15.8 oz (88.9 kg) IBW/kg (Calculated) : 66.1 Heparin Dosing Weight: 85.1 kg  Vital Signs: Temp: 98.7 F (37.1 C) (09/21 0738) Temp Source: Oral (09/21 0738) BP: 124/88 (09/21 1000) Pulse Rate: 71 (09/21 1000)  Labs:  Recent Labs  07/12/17 1729 07/12/17 1738  07/12/17 2352 07/13/17 0220 07/13/17 0659 07/13/17 0935  HGB 13.7  --   --   --  13.2  --   --   HCT 40.6  --   --   --  38.5*  --   --   PLT 258  --   --   --  238  --   --   APTT  --  25  --   --   --   --   --   LABPROT  --  13.5  --   --   --   --   --   INR  --  1.04  --   --   --   --   --   HEPARINUNFRC  --   --   --   --  0.36  --  0.30  CREATININE 0.94  --   --   --   --   --   --   TROPONINI 0.53*  --   < > 0.67* 0.67* 0.59*  --   < > = values in this interval not displayed.  Estimated Creatinine Clearance: 96.7 mL/min (by C-G formula based on SCr of 0.94 mg/dL).   Medications:  Heparin infusing at 1150 units/hr  Assessment: Anti-Xa level at 0.3 units/ml is within the therapeutic goal. Pt is tolerating anticoagulation without problems and is awaiting transfer to [facility] for cardiology management.  Goal of Therapy:  Heparin level 0.3-0.7 units/ml Monitor platelets by anticoagulation protocol: Yes  Plan:  Continue heparin infusion at 1150 units/hr Monitor anti-Xa level daily while on heparin Monitor H&H and platelets  Ulysess Witz Poteet 07/13/2017,11:01 AM

## 2017-07-13 NOTE — H&P (Signed)
TR[facility] H&P    Patient Demographics:    Corey [name], is a 51 y.o. male  MRN: [id number]  DOB - [birth year]  Admit Date - 07/12/2017  Referring MD/NP/PA: Dr. Tomi Bamberger  Outpatient Primary MD for the patient is Wolfgang Phoenix, Grace Bushy, MD  Patient coming from: Home  Chief Complaint  Patient presents with  . Chest Pain      HPI:    Corey [name]  is a 51 y.o. male,  With history of diabetes mellitus, hypertension, hyperlipidemia who came to ED with right-sided chest soreness which has been constant over past 24 hours. He was seen by his PCP for chest pain and was referred to ED for further evaluation. He denies any shortness of breath. No nausea vomiting or diarrhea. Pain got worse on moving right arm. He denies diaphoresis. Patient is strong family history of MI. His father died of MI at the age of 43 while getting colonoscopy for removal of polyps.  The ED patient had elevated troponin 0.53 with active chest pain. Patient was started on aspirin, sublingual nitroglycerin and heparin drip. Patient was planned to transfer to [facility] under cardiology service.  Overnight no beds are available at [facility], patient's troponin has stabilized 0.65, 0.67, 0.67. Also patient is now chest pain-free.  Dr. Tomi Bamberger called and discussed with cardiology fellow at [facility], who recommended patient to be admitted at [facility] with evaluation by cardiology in a.m.Marland Kitchen      Review of systems:    In addition to the HPI above,  No Fever-chills, No Headache, No changes with Vision or hearing, No problems swallowing food or Liquids, No Abdominal pain, No Nausea or Vomiting, bowel movements are regular, No Blood in stool or Urine, No dysuria, No new skin rashes or bruises, No new joints pains-aches,  No new weakness, tingling, numbness in any extremity, No recent weight gain or loss, No polyuria, polydypsia or polyphagia, No  significant Mental Stressors.  All other systems reviewed and are negative.   With Past History of the following :    Past Medical History:  Diagnosis Date  . Diabetes mellitus without complication (HCC)    Type 2  . Hypertension   . Hypertriglyceridemia       Past Surgical History:  Procedure Laterality Date  . COLONOSCOPY N/A 10/14/2014   Procedure: COLONOSCOPY;  Surgeon: Daneil Dolin, MD;  Location: [facility];  Service: Endoscopy;  Laterality: N/A;  8:30 AM  . NO PAST SURGERIES        Social History:      Social History  Substance Use Topics  . Smoking status: Former Smoker    Packs/day: 0.50    Years: 10.00    Types: Cigarettes    Quit date: 02/07/1993  . Smokeless tobacco: Never Used  . Alcohol use No       Family History :     Family History  Problem Relation Age of Onset  . Colon cancer Other   . Cancer Father   . Hypertension Father   .  Diabetes Father   . Heart attack Father   . Colon cancer Maternal Uncle       Home Medications:   Prior to Admission medications   Medication Sig Start Date End Date Taking? Authorizing Provider  atorvastatin (LIPITOR) 10 MG tablet TAKE 1 TABLET BY MOUTH ONCE DAILY 06/19/17  Yes Mikey Kirschner, MD  canagliflozin (INVOKANA) 300 MG TABS tablet TAKE 1 TABLET BY MOUTH EVERY DAY BEFORE BREAKFAST 08/22/16  Yes Mikey Kirschner, MD  fenofibrate 160 MG tablet TAKE 1 TABLET BY MOUTH ONCE DAILY 03/20/17  Yes Mikey Kirschner, MD  glipiZIDE (GLUCOTROL) 5 MG tablet Take 1 tablet in the am and 2 tablets in the pm 08/22/16  Yes Mikey Kirschner, MD  losartan (COZAAR) 50 MG tablet TAKE 1 TABLET BY MOUTH ONCE DAILY 04/17/17  Yes Mikey Kirschner, MD  metFORMIN (GLUCOPHAGE) 500 MG tablet TAKE 2 TABLETS BY MOUTH ONCE DAILY IN THE MORNING, 1 TABLET AT NOON, AND  2 TABLETS  IN THE EVENING 05/10/17  Yes Mikey Kirschner, MD  naproxen sodium (ANAPROX) 220 MG tablet Take 220 mg by mouth daily as needed (pain).   Yes [provider]  blood glucose meter kit and supplies KIT Dispense based on patient and insurance preference. Test blood sugar once daily. Type 2 diabetes. E11.9 04/27/15   Mikey Kirschner, MD     Allergies:    No Known Allergies   Physical Exam:   Vitals  Blood pressure (!) 163/80, pulse 98, temperature 98.4 F (36.9 C), temperature source Oral, resp. rate 18, height '5\' 7"'  (1.702 m), weight 90.7 kg (200 lb), SpO2 96 %.  1.  General: Appears in no acute distress  2. Psychiatric:  Intact judgement and  insight, awake alert, oriented x 3.  3. Neurologic: No focal neurological deficits, all cranial nerves intact.Strength 5/5 all 4 extremities, sensation intact all 4 extremities, plantars down going.  4. Eyes :  anicteric sclerae, moist conjunctivae with no lid lag. PERRLA.  5. ENMT:  Oropharynx clear with moist mucous membranes and good dentition  6. Neck:  supple, no cervical lymphadenopathy appriciated, No thyromegaly  7. Respiratory : Normal respiratory effort, good air movement bilaterally,clear to  auscultation bilaterally  8. Cardiovascular : RRR, no gallops, rubs or murmurs, no leg edema  9. Gastrointestinal:  Positive bowel sounds, abdomen soft, non-tender to palpation,no hepatosplenomegaly, no rigidity or guarding       10. Skin:  No cyanosis, normal texture and turgor, no rash, lesions or ulcers  11.Musculoskeletal:  Good muscle tone,  joints appear normal , no effusions,  normal range of motion    Data Review:    CBC  Recent Labs Lab 07/12/17 1729 07/13/17 0220  WBC 4.2 3.8*  HGB 13.7 13.2  HCT 40.6 38.5*  PLT 258 238  MCV 80.9 80.4  MCH 27.3 27.6  MCHC 33.7 34.3  RDW 14.1 13.9   ------------------------------------------------------------------------------------------------------------------  Chemistries   Recent Labs Lab 07/12/17 1729  NA 142  K 4.3  CL 105  CO2 27  GLUCOSE 88  BUN 10  CREATININE 0.94  CALCIUM 10.2    ------------------------------------------------------------------------------------------------------------------  ------------------------------------------------------------------------------------------------------------------ GFR: Estimated Creatinine Clearance: 97.6 mL/min (by C-G formula based on SCr of 0.94 mg/dL). Liver Function Tests: No results for input(s): AST, ALT, ALKPHOS, BILITOT, PROT, ALBUMIN in the last 168 hours. No results for input(s): LIPASE, AMYLASE in the last 168 hours. No results for input(s): AMMONIA in the last 168 hours. Coagulation Profile:  Recent Labs Lab 07/12/17 1738  INR 1.04   Cardiac Enzymes:  Recent Labs Lab 07/12/17 1729 07/12/17 2020 07/12/17 2352 07/13/17 0220  TROPONINI 0.53* 0.65* 0.67* 0.67*    --------------------------------------------------------------------------------------------------------------- Urine analysis: No results found for: COLORURINE, APPEARANCEUR, LABSPEC, PHURINE, GLUCOSEU, HGBUR, BILIRUBINUR, KETONESUR, PROTEINUR, UROBILINOGEN, NITRITE, LEUKOCYTESUR    Imaging Results:    Dg Chest 2 View  Result Date: 07/12/2017 CLINICAL DATA:  Chest pain since last night EXAM: CHEST  2 VIEW COMPARISON:  None. FINDINGS: Heart size and mediastinal contours are within normal limits. Lungs are clear. No pleural effusion or pneumothorax seen. Elevation of the right hemidiaphragm, of uncertain acuity. Osseous and soft tissue structures about the chest are otherwise unremarkable. IMPRESSION: No active cardiopulmonary disease. No evidence of pneumonia or pulmonary edema. Electronically Signed   By: Franki Cabot M.D.   On: 07/12/2017 15:56    My personal review of EKG: Rhythm NSR,RBBB   Assessment & Plan:    Active Problems:   NSTEMI (non-ST elevated myocardial infarction) ([place])   1. NSTEMI-patient started on aspirin, heparin protocol. Patient is currently chest pain-free. We'll cycle troponin every 6 hours 3. We'll  consult cardiology in a.m.  2. Hypertension-blood pressure uncontrolled, patient came with elevated blood pressure of 174/111, continue Cozaar. We'll add metoprolol 25 minute grams by mouth twice a day. 3. Diabetes mellitus-hold oral hyperglycemic agents, will start sliding scale insulin with NovoLog. Check CBG every 6 hours. Patient is currently nothing by mouth. 4. Hyperlipidemia -continue fenofibrate.    DVT Prophylaxis-   Heparin   AM Labs Ordered, also please review Full Orders  Family Communication: Admission, patients condition and plan of care including tests being ordered have been discussed with the patient  who indicate understanding and agree with the plan and Code Status.  Code Status:   full code   Admission status:Inpatient time spent in minutes : 60 minutes   LAMA,GAGAN S M.D on 07/13/2017 at 5:50 AM  Between 7am to 7pm - Pager - [phone]. After 7pm go to [email or web address] - password [facility]  [facility] - Office  [phone]

## 2017-07-13 NOTE — ED Notes (Signed)
CRITICAL VALUE ALERT  Critical Value:  Troponin 0.67  Date & Time Notied:  07/13/17 & 0041 hrs  Provider Notified: [facility] Paged  Orders Received/Actions taken: N/A.

## 2017-07-13 NOTE — ED Notes (Signed)
Spoke to Dr. Dwain Sarna from [facility] and received admission orders for patient, after heparin level drawn,  Contact on-cal pharmacy to determine if dosage needs to be adjusted.

## 2017-07-13 NOTE — ED Provider Notes (Signed)
D/W [facility], Cardiology, agrees that patient can be admitted to hospitalist service here and see the cardiologist here in the morning.    Patient has been pain-free since I came on shift, his troponins have plateaued out at 0.67.  04:53 AM Dr Sharl Ma, hospitlist, will admit  Plan admission to [facility]  Devoria Albe, MD, Iline Oven, Jodelle Gross, MD 07/13/17 [phone]

## 2017-07-13 NOTE — Progress Notes (Signed)
Patient transferred to [facility] Cath Lab by [facility].

## 2017-07-13 NOTE — Interval H&P Note (Signed)
History and Physical Interval Note:  07/13/2017 2:31 PM  Corey [name]  has presented today for cardiac cath with the diagnosis of NSTEMI.  The various methods of treatment have been discussed with the patient and family. After consideration of risks, benefits and other options for treatment, the patient has consented to  Procedure(s): LEFT HEART CATH AND CORONARY ANGIOGRAPHY (N/A) as a surgical intervention .  The patient's history has been reviewed, patient examined, no change in status, stable for surgery.  I have reviewed the patient's chart and labs.  Questions were answered to the patient's satisfaction.    Cath Lab Visit (complete for each Cath Lab visit)  Clinical Evaluation Leading to the Procedure:   ACS: Yes.    Non-ACS:    Anginal Classification: CCS III  Anti-ischemic medical therapy: No Therapy  Non-Invasive Test Results: No non-invasive testing performed  Prior CABG: No previous CABG         Verne Carrow

## 2017-07-13 NOTE — Progress Notes (Signed)
ANTICOAGULATION CONSULT NOTE - Follow Up Consult  Pharmacy Consult for Heparin Indication: rule out PE  No Known Allergies  Patient Measurements: Height:  (170.2 cm) Weight: 195 lb 15.8 oz (88.9 kg) IBW/kg (Calculated) : 66.1 Heparin Dosing Weight: 85.1 kg  Vital Signs: Temp: 98.7 F (37.1 C) (09/21 0738) Temp Source: Oral (09/21 0738) BP: 124/89 (09/21 1511) Pulse Rate: 90 (09/21 1511)  Labs:  Recent Labs  07/12/17 1729 07/12/17 1738  07/13/17 0220 07/13/17 0659 07/13/17 0935 07/13/17 1213  HGB 13.7  --   --  13.2  --   --   --   HCT 40.6  --   --  38.5*  --   --   --   PLT 258  --   --  238  --   --   --   APTT  --  25  --   --   --   --   --   LABPROT  --  13.5  --   --   --   --   --   INR  --  1.04  --   --   --   --   --   HEPARINUNFRC  --   --   --  0.36  --  0.30  --   CREATININE 0.94  --   --   --   --   --   --   TROPONINI 0.53*  --   < > 0.67* 0.59*  --  0.45*  < > = values in this interval not displayed.  Estimated Creatinine Clearance: 96.7 mL/min (by C-G formula based on SCr of 0.94 mg/dL).   Medications:  Scheduled:  . [MAR Hold] aspirin  325 mg Oral Daily  . [MAR Hold] atorvastatin  10 mg Oral Daily  . [MAR Hold] fenofibrate  160 mg Oral Daily  . [MAR Hold] Influenza vac split quadrivalent PF  0.5 mL Intramuscular Tomorrow-1000  . [MAR Hold] insulin aspart  0-9 Units Subcutaneous TID WC  . [MAR Hold] losartan  50 mg Oral Daily  . [MAR Hold] metoprolol tartrate  25 mg Oral BID  . [MAR Hold] pneumococcal 23 valent vaccine  0.5 mL Intramuscular Tomorrow-1000   Infusions:  . sodium chloride    . heparin 1,150 Units/hr (07/12/17 1928)    Assessment: 51 yo M presented to [facility] with CP and elevated troponin.  Transferred to [facility] 9/21 for cardiac cath.  Cath showed non-obstructive CAD.  Now plans for CTA to rule out PE.  Per MD, will restart heparin at 10pm tonight (~6 hours after sheath pull). Pt was previously at low-end of heparin goal on 1150  units/hr.  Goal of Therapy:  Heparin level 0.3-0.7 units/ml Monitor platelets by anticoagulation protocol: Yes   Plan:  Heparin 1200 units/hr (start at 2200 9/21) Heparin level at 0600 9/22 Heparin level and CBC daily while on heparin  [employer], Pharm.D., BCPS Clinical Pharmacist 07/13/2017 4:04 PM

## 2017-07-13 NOTE — H&P (View-Only) (Signed)
 Cardiology Consultation:   Patient ID: Corey [name]; [id number]; 02/07/1966   Admit date: 07/12/2017 Date of Consult: 07/13/2017  Primary Care Provider: Luking, William S, MD Consulting Cardiologist: Dr Samuel G. McDowell    Patient Profile:   Corey [name] is a 51 y.o. male with a history of hypertension, type II diabetes, and hypertriglyceridemia who is being seen today for the evaluation of chest pain and abnormal troponin levels at the request of Dr. Lama and Dr Luking.  History of Present Illness:   [name] is followed as OP by Dr. Luking who saw him in his office yesterday 07/12/2017 with complaints of chest pain. Review of Dr. Luking's note reveals that the patient began right sided chest discomfort 18 hours prior to the appointment (was seen at 2 pm) described as tightness. Due to CVRF's and symptoms he was sent to ER for further evaluation.   He has no prior cardiac history and and never had chest discomfort in the past. He has been medically compliant.   On arrival to ER, BP 136/97, HR 73, O2 sat 98%. He has BP elevation after he arrived one hour later with BP of 174-11. EKG demonstrated SR, RBBB, HR 77 bpm. Troponin was found to be elevated,  Initial 0.53, 0.65, and 0.67 with follow up levels of 0.67, 0.67, and 0.67 respectively. WBC were noted to be 3.9. CXR did not reveal CHF, or pneumonia. He was treated with NTG, ASA, and started on heparin gtt. He is on the list for transfer to [facility] when bed available.   He is currently pain free and comfortable.   Past Medical History:  Diagnosis Date  . Essential hypertension   . Hypertriglyceridemia   . Type 2 diabetes mellitus (HCC)     Past Surgical History:  Procedure Laterality Date  . COLONOSCOPY N/A 10/14/2014   Procedure: COLONOSCOPY;  Surgeon: Robert M Rourk, MD;  Location: [facility];  Service: Endoscopy;  Laterality: N/A;  8:30 AM  . NO PAST SURGERIES       Home Medications:  Prior to Admission medications     Medication Sig Start Date End Date Taking? Authorizing Provider  atorvastatin (LIPITOR) 10 MG tablet TAKE 1 TABLET BY MOUTH ONCE DAILY 06/19/17  Yes Luking, William S, MD  canagliflozin (INVOKANA) 300 MG TABS tablet TAKE 1 TABLET BY MOUTH EVERY DAY BEFORE BREAKFAST 08/22/16  Yes Luking, William S, MD  fenofibrate 160 MG tablet TAKE 1 TABLET BY MOUTH ONCE DAILY 03/20/17  Yes Luking, William S, MD  glipiZIDE (GLUCOTROL) 5 MG tablet Take 1 tablet in the am and 2 tablets in the pm 08/22/16  Yes Luking, William S, MD  losartan (COZAAR) 50 MG tablet TAKE 1 TABLET BY MOUTH ONCE DAILY 04/17/17  Yes Luking, William S, MD  metFORMIN (GLUCOPHAGE) 500 MG tablet TAKE 2 TABLETS BY MOUTH ONCE DAILY IN THE MORNING, 1 TABLET AT NOON, AND  2 TABLETS  IN THE EVENING 05/10/17  Yes Luking, William S, MD  naproxen sodium (ANAPROX) 220 MG tablet Take 220 mg by mouth daily as needed (pain).   Yes [provider]  blood glucose meter kit and supplies KIT Dispense based on patient and insurance preference. Test blood sugar once daily. Type 2 diabetes. E11.9 04/27/15   Luking, William S, MD    Inpatient Medications: Scheduled Meds: . aspirin  325 mg Oral Daily  . atorvastatin  10 mg Oral Daily  . fenofibrate  160 mg Oral Daily  . [START ON 07/14/2017]   Influenza vac split quadrivalent PF  0.5 mL Intramuscular Tomorrow-1000  . insulin aspart  0-9 Units Subcutaneous TID WC  . losartan  50 mg Oral Daily  . metoprolol tartrate  25 mg Oral BID  . [START ON 07/14/2017] pneumococcal 23 valent vaccine  0.5 mL Intramuscular Tomorrow-1000   Continuous Infusions: . sodium chloride    . heparin 1,150 Units/hr (07/12/17 1928)   PRN Meds: acetaminophen, morphine injection, ondansetron **OR** ondansetron (ZOFRAN) IV  Allergies:   No Known Allergies  Social History:   Social History   Social History  . Marital status: Married    Spouse name: N/A  . Number of children: N/A  . Years of education: N/A   Occupational  History  . Not on file.   Social History Main Topics  . Smoking status: Former Smoker    Packs/day: 0.50    Years: 10.00    Types: Cigarettes    Quit date: 02/07/1993  . Smokeless tobacco: Never Used  . Alcohol use Yes     Comment: occasionally  . Drug use: No  . Sexual activity: Yes   Other Topics Concern  . Not on file   Social History Narrative  . No narrative on file    Family History:    Family History  Problem Relation Age of Onset  . Colon cancer Other   . Cancer Father   . Hypertension Father   . Diabetes Father   . Heart attack Father   . Colon cancer Maternal Uncle      ROS:  Please see the history of present illness.  ROS  No recent fevers or chills, no cough, no bleeding diathesis, no stool changes. All other ROS reviewed and negative.     Physical Exam/Data:   Vitals:   07/13/17 0650 07/13/17 0700 07/13/17 0738 07/13/17 0800  BP:  (!) 141/89  (!) 141/103  Pulse:  78  80  Resp:  18  (!) 21  Temp: 99 F (37.2 C)  98.7 F (37.1 C)   TempSrc: Oral  Oral   SpO2:  98%  97%  Weight: 195 lb 15.8 oz (88.9 kg)     Height: 5' 7" (1.702 m)      No intake or output data in the 24 hours ending 07/13/17 0930 Filed Weights   07/12/17 1539 07/13/17 0650  Weight: 200 lb (90.7 kg) 195 lb 15.8 oz (88.9 kg)   Body mass index is 30.7 kg/m.   General:  Well nourished, well developed, in no acute distress HEENT: normal Lymph: no adenopathy Neck: no JVD Endocrine:  No thryomegaly Vascular: No carotid bruits; FA pulses 2+ bilaterally without bruits  Cardiac:  normal S1, S2; RRR; no murmur  Lungs:  clear to auscultation bilaterally, no wheezing, rhonchi or rales  Abd: soft, nontender, no hepatomegaly  Ext: no edema Musculoskeletal:  No deformities, BUE and BLE strength normal and equal Skin: warm and dry  Neuro:  CNs 2-12 intact, no focal abnormalities noted Psych:  Normal affect   EKG:  The EKG from 07/12/2017 shows sinus rhythm with right bundle branch  block, personally reviewed.  Telemetry:  Telemetry was personally reviewed and demonstrates sinus rhythm.  Laboratory Data:  Chemistry  Recent Labs Lab 07/12/17 1729  NA 142  K 4.3  CL 105  CO2 27  GLUCOSE 88  BUN 10  CREATININE 0.94  CALCIUM 10.2  GFRNONAA >60  GFRAA >60  ANIONGAP 10     Hematology  Recent Labs Lab 07/12/17   1729 07/13/17 0220  WBC 4.2 3.8*  RBC 5.02 4.79  HGB 13.7 13.2  HCT 40.6 38.5*  MCV 80.9 80.4  MCH 27.3 27.6  MCHC 33.7 34.3  RDW 14.1 13.9  PLT 258 238   Cardiac Enzymes  Recent Labs Lab 07/12/17 1729 07/12/17 2020 07/12/17 2352 07/13/17 0220 07/13/17 0659  TROPONINI 0.53* 0.65* 0.67* 0.67* 0.59*    Radiology/Studies:  Dg Chest 2 View  Result Date: 07/12/2017 CLINICAL DATA:  Chest pain since last night EXAM: CHEST  2 VIEW COMPARISON:  None. FINDINGS: Heart size and mediastinal contours are within normal limits. Lungs are clear. No pleural effusion or pneumothorax seen. Elevation of the right hemidiaphragm, of uncertain acuity. Osseous and soft tissue structures about the chest are otherwise unremarkable. IMPRESSION: No active cardiopulmonary disease. No evidence of pneumonia or pulmonary edema. Electronically Signed   By: Stan  Maynard M.D.   On: 07/12/2017 15:56    Assessment and Plan:   1. NSTEMI: Patient with intermittent chest discomfort at this point. EKG did not reveal acute ST-T wave abnormalities. He has RBBB unclear if this is new finding as no prior EKG's available for comparison. I have discussed plan for cardiac catheterization in this setting and gave a detailed explanation of the procedure. The patient understands that risks include but are not limited to stroke (1 in 1000), death (1 in 1000), kidney failure [usually temporary] (1 in 500), bleeding (1 in 200), allergic reaction [possibly serious] (1 in 200), and agrees to proceed. Questions have been answered.   He is on the cath board at [facility] to be done today. Care  Link is aware. He will be given clear liquids to drink as he has not had food since yesterday am, with his medications. Continue heparin until on call to cath lab.  2. Hypertension:  BP is not optimal for diabetic patient with potential CAD. Remains on metoprolol 25 mg BID, and is on losartan 50 mg daily. Will increase to 75 mg daily. Creatinine 0.94.   3. Diabetes: Per PCP team. Currently controlled on sliding scale insulin.    Signed, Kathryn Lawrence DNP, ANP, AACC.  07/13/2017 9:30 AM    Attending note:  Patient seen and examined. I reviewed his records and updated the chart. I also modified above note by Ms. Lawrence DNP. Mr. Lhommedieu presents via office evaluation by Dr. Luking yesterday with recent onset prolonged chest discomfort described as a right sided tightness, eventually shifting to the left side. He does not endorse any specific precipitant for the symptoms, has had no chest pain like this in the past. He reports compliance with his medications for hypertension and type 2 diabetes mellitus. Did not have any associated palpitations or syncope, mildly short of breath and fatigue. Reports no recent change in stamina, no orthopnea or PND.  On examination this morning he appears comfortable. Reports having intermittent chest discomfort although better than at presentation. Recent systolic blood pressure 1:30 with heart rate in the 70s in sinus rhythm by telemetry. Lungs are clear without labored breathing. Cardiac exam reveals RRR without gallop or rub. Distal pulses are full. Lab work shows peak troponin I 0.76, creatinine 0.94, potassium 4.3, hemoglobin 13.2, platelets 238. Chest x-ray shows no acute findings. ECG shows sinus rhythm with right bundle branch block of uncertain duration.  Patient presents with unstable angina and enzymatic evidence of NSTEMI. He is on IV heparin, aspirin, Lipitor, Cozaar, and Lopressor. Plan is for transfer to the cardiac catheterization lab at [facility] for diagnostic cardiac catheterization today. Risks and anticipated benefits have been discussed and he is in agreement to proceed.  Samuel G. McDowell, M.D., F.A.C.C.     

## 2017-07-14 ENCOUNTER — Inpatient Hospital Stay (HOSPITAL_COMMUNITY): Payer: BLUE CROSS/BLUE SHIELD

## 2017-07-14 DIAGNOSIS — I5030 Unspecified diastolic (congestive) heart failure: Secondary | ICD-10-CM

## 2017-07-14 LAB — ECHOCARDIOGRAM COMPLETE
HEIGHTINCHES: 67 in
Weight: 3135.82 oz

## 2017-07-14 LAB — COMPREHENSIVE METABOLIC PANEL
ALBUMIN: 4.2 g/dL (ref 3.5–5.0)
ALT: 40 U/L (ref 17–63)
AST: 39 U/L (ref 15–41)
Alkaline Phosphatase: 23 U/L — ABNORMAL LOW (ref 38–126)
Anion gap: 7 (ref 5–15)
BUN: 10 mg/dL (ref 6–20)
CHLORIDE: 105 mmol/L (ref 101–111)
CO2: 26 mmol/L (ref 22–32)
CREATININE: 1.05 mg/dL (ref 0.61–1.24)
Calcium: 9.6 mg/dL (ref 8.9–10.3)
GFR calc Af Amer: >60 mL/min (ref >60)
GLUCOSE: 154 mg/dL — AB (ref 65–99)
Potassium: 4.5 mmol/L (ref 3.5–5.1)
Sodium: 138 mmol/L (ref 135–145)
Total Bilirubin: 0.9 mg/dL (ref 0.3–1.2)
Total Protein: 7.5 g/dL (ref 6.5–8.1)

## 2017-07-14 LAB — HEPARIN LEVEL (UNFRACTIONATED): HEPARIN UNFRACTIONATED: 0.15 [IU]/mL — AB (ref 0.30–0.70)

## 2017-07-14 LAB — GLUCOSE, CAPILLARY
GLUCOSE-CAPILLARY: 168 mg/dL — AB (ref 65–99)
GLUCOSE-CAPILLARY: 139 mg/dL — AB (ref 65–99)
Glucose-Capillary: 135 mg/dL — ABNORMAL HIGH (ref 65–99)
Glucose-Capillary: 160 mg/dL — ABNORMAL HIGH (ref 65–99)

## 2017-07-14 LAB — CBC
HCT: 42.5 % (ref 39.0–52.0)
Hemoglobin: 14.2 g/dL (ref 13.0–17.0)
MCH: 26.9 pg (ref 26.0–34.0)
MCHC: 33.4 g/dL (ref 30.0–36.0)
MCV: 80.5 fL (ref 78.0–100.0)
PLATELETS: 275 10*3/uL (ref 150–400)
RBC: 5.28 MIL/uL (ref 4.22–5.81)
RDW: 14 % (ref 11.5–15.5)
WBC: 3.9 10*3/uL — AB (ref 4.0–10.5)

## 2017-07-14 LAB — HIV ANTIBODY (ROUTINE TESTING W REFLEX): HIV Screen 4th Generation wRfx: NONREACTIVE

## 2017-07-14 MED ORDER — HEPARIN BOLUS VIA INFUSION
2550.0000 [IU] | Freq: Once | INTRAVENOUS | Status: AC
  Administered 2017-07-14: 2550 [IU] via INTRAVENOUS
  Filled 2017-07-14: qty 2550

## 2017-07-14 MED ORDER — IOPAMIDOL (ISOVUE-370) INJECTION 76%
INTRAVENOUS | Status: AC
  Administered 2017-07-14: 100 mL via INTRAVENOUS
  Filled 2017-07-14: qty 100

## 2017-07-14 MED ORDER — CLOPIDOGREL BISULFATE 75 MG PO TABS
75.0000 mg | ORAL_TABLET | Freq: Every day | ORAL | Status: DC
Start: 2017-07-14 — End: 2017-07-14
  Administered 2017-07-14: 75 mg via ORAL
  Filled 2017-07-14: qty 1

## 2017-07-14 MED ORDER — ASPIRIN EC 81 MG PO TBEC: 81.0000 mg | DELAYED_RELEASE_TABLET | Freq: Every day | ORAL | Status: DC

## 2017-07-14 MED ORDER — CLOPIDOGREL BISULFATE 75 MG PO TABS
75.0000 mg | ORAL_TABLET | Freq: Every day | ORAL | 11 refills | Status: DC
Start: 2017-07-15 — End: 2018-07-22

## 2017-07-14 MED ORDER — METOPROLOL TARTRATE 25 MG PO TABS
25.0000 mg | ORAL_TABLET | Freq: Two times a day (BID) | ORAL | 11 refills | Status: DC
Start: 2017-07-14 — End: 2020-05-14

## 2017-07-14 MED ORDER — ASPIRIN 81 MG PO TBEC: 81.0000 mg | DELAYED_RELEASE_TABLET | Freq: Every day | ORAL | Status: AC

## 2017-07-14 NOTE — Discharge Summary (Signed)
Discharge Summary    Patient ID: Corey [name],  MRN: [id number], DOB/AGE: August 27, 1966 51 y.o.  Admit date: 07/12/2017 Discharge date: 07/15/2017   Primary Care Provider: Mikey Kirschner Primary Cardiologist: Dr. Domenic Polite saw in hospital (lives in [place])  Discharge Diagnoses    Principal Problem:   NSTEMI (non-ST elevated myocardial infarction) [facility]) Active Problems:   Diabetes ([place])   Essential hypertension, benign   Elevated troponin   Allergies No Known Allergies   History of Present Illness     Corey [name] is a 51 y.o. male with a history of hypertension, type II diabetes, and hypertriglyceridemia who was evaluated for the evaluation of chest pain and abnormal troponin levels.    Mr. Bohman is followed as OP by Dr. Wolfgang Phoenix who saw him in his office yesterday 07/12/2017 with complaints of chest pain. Review of Dr. Lance Sell note reveals that the patient began right sided chest discomfort 18 hours prior to the appointment (was seen at 2 pm) described as tightness. Due to CVRF's and symptoms he was sent to ER for further evaluation.   He has no prior cardiac history and and never had chest discomfort in the past. He has been medically compliant.   On arrival to ER, BP 136/97, HR 73, O2 sat 98%. He has BP elevation after he arrived one hour later with BP of 174-11. EKG demonstrated SR, RBBB, HR 77 bpm. Troponin was found to be elevated,  Initial 0.53, 0.65, and 0.67 with follow up levels of 0.67, 0.67, and 0.67 respectively. WBC were noted to be 3.9. CXR did not reveal CHF, or pneumonia. He was treated with NTG, ASA, and started on heparin gtt. He is on the list for transfer to [facility] when bed available.   He is currently pain free and comfortable.   Hospital Course     Consultants: none  He underwent heart catheterization, which showed nonobstuctive disease but possible aortic root enlargement. Interventionalist speculated chest pain may have been related to coronary  vasospasm vs myocarditis. Pt denies chest pain today. ASA was decreased to 81 mg and plavix was added.  CT scan showed Heart is normal size. Ectasia of the ascending thoracic aorta measuring 3.9 cm in AP diameter. No evidence of dissection.      HTN Continue losartan and new lopressor. BP controlled.  HLD Continue statin and fenofibrate  DM A1c this admission 7.8. Continue home invokana, glucotrol, and metformen. Not well controlled. Will follow up with PCP for further management.   Patient seen and examined by Dr. Domenic Polite today and was stable for discharge. All follow up has been arranged.  _____________  Discharge Vitals Blood pressure 121/80, pulse 90, temperature 98.6 F (37 C), temperature source Oral, resp. rate 18, height '5\' 7"'  (1.702 m), weight 195 lb 15.8 oz (88.9 kg), SpO2 100 %.  Filed Weights   07/12/17 1539 07/13/17 0650  Weight: 200 lb (90.7 kg) 195 lb 15.8 oz (88.9 kg)    Labs & Radiologic Studies    CBC  Recent Labs  07/13/17 0220 07/14/17 0630  WBC 3.8* 3.9*  HGB 13.2 14.2  HCT 38.5* 42.5  MCV 80.4 80.5  PLT 238 570   Basic Metabolic Panel  Recent Labs  07/12/17 1729 07/14/17 0630  NA 142 138  K 4.3 4.5  CL 105 105  CO2 27 26  GLUCOSE 88 154*  BUN 10 10  CREATININE 0.94 1.05  CALCIUM 10.2 9.6   Liver Function Tests  Recent Labs  07/14/17 0630  AST 39  ALT 40  ALKPHOS 23*  BILITOT 0.9  PROT 7.5  ALBUMIN 4.2   No results for input(s): LIPASE, AMYLASE in the last 72 hours. Cardiac Enzymes  Recent Labs  07/13/17 0220 07/13/17 0659 07/13/17 1213  TROPONINI 0.67* 0.59* 0.45*   BNP Invalid input(s): POCBNP D-Dimer  Recent Labs  07/13/17 1631  DDIMER 0.32   Hemoglobin A1C  Recent Labs  07/13/17 0659  HGBA1C 7.8*   Fasting Lipid Panel No results for input(s): CHOL, HDL, LDLCALC, TRIG, CHOLHDL, LDLDIRECT in the last 72 hours. Thyroid Function Tests No results for input(s): TSH, T4TOTAL, T3FREE, THYROIDAB in the  last 72 hours.  Invalid input(s): FREET3 _____________  Dg Chest 2 View  Result Date: 07/12/2017 CLINICAL DATA:  Chest pain since last night EXAM: CHEST  2 VIEW COMPARISON:  None. FINDINGS: Heart size and mediastinal contours are within normal limits. Lungs are clear. No pleural effusion or pneumothorax seen. Elevation of the right hemidiaphragm, of uncertain acuity. Osseous and soft tissue structures about the chest are otherwise unremarkable. IMPRESSION: No active cardiopulmonary disease. No evidence of pneumonia or pulmonary edema. Electronically Signed   By: Franki Cabot M.D.   On: 07/12/2017 15:56   Ct Angio Chest Aorta W/cm &/or Wo/cm  Result Date: 07/14/2017 CLINICAL DATA:  Follow-up of known thoracic aortic aneurysm. EXAM: CT ANGIOGRAPHY CHEST WITH CONTRAST TECHNIQUE: Multidetector CT imaging of the chest was performed using the standard protocol during bolus administration of intravenous contrast. Multiplanar CT image reconstructions and MIPs were obtained to evaluate the vascular anatomy. CONTRAST:  100 mL Isovue 370 IV. COMPARISON:  None. FINDINGS: Cardiovascular: Heart is normal size. Ectasia of the ascending thoracic aorta measuring 3.9 cm in AP diameter. No evidence of dissection. Mid aortic arch measures 2.8 cm diameter. Descending thoracic aorta measures 3.1 cm in greatest AP diameter proximally. Common takeoff of the right brachiocephalic and common carotid arteries from the arch as the great vessels are otherwise within normal. Pulmonary arterial system within normal. Mediastinum/Nodes: No mediastinal or hilar adenopathy. Remaining mediastinal structures are normal. Lungs/Pleura: Lungs are well inflated with minimal linear atelectasis over the right base. No effusion. Airways are normal. Upper Abdomen: Unremarkable. Musculoskeletal: Within normal. Review of the MIP images confirms the above findings. IMPRESSION: Moderate ectasia of the ascending thoracic aorta measuring 3.9 cm in AP  diameter. No definitive aneurysm or dissection. Recommend annual imaging followup by CTA or MRA. This recommendation follows 2010 ACCF/AHA/AATS/ACR/ASA/SCA/SCAI/SIR/STS/SVM Guidelines for the Diagnosis and Management of Patients with Thoracic Aortic Disease. Circulation.2010; 121: K354-S568. Electronically Signed   By: Marin Olp M.D.   On: 07/14/2017 19:11     Diagnostic Studies/Procedures    CTA 07/14/17: IMPRESSION: Moderate ectasia of the ascending thoracic aorta measuring 3.9 cm in AP diameter. No definitive aneurysm or dissection. Recommend annual imaging followup by CTA or MRA.   Echocardiogram 07/14/17: Study Conclusions - Left ventricle: The cavity size was normal. Wall thickness was   increased in a pattern of mild LVH. Systolic function was normal.   The estimated ejection fraction was in the range of 60% to 65%.   Wall motion was normal; there were no regional wall motion   abnormalities. Doppler parameters are consistent with abnormal   left ventricular relaxation (grade 1 diastolic dysfunction). - Aortic valve: There was trivial regurgitation. - Aortic root: The aortic root was mildly ectatic. - Mitral valve: There was trivial regurgitation. - Right atrium: Central venous pressure (est): 3 mm Hg. - Atrial septum:  No defect or patent foramen ovale was identified. - Tricuspid valve: There was trivial regurgitation. - Pulmonary arteries: PA peak pressure: 13 mm Hg (S). - Pericardium, extracardiac: There was no pericardial effusion.  Impressions: - Mild LVH with LVEF 60-65% and grade 1 diastolic dysfunction.   Trivial mitral regurgitation. Trivial aortic regurgitation.   Mildly ectatic aortic root. Trivial tricuspid regurgitation with   PASP estimated 13 mmHg.   Left heart cath 07/13/17:  Prox Cx to Mid Cx lesion, 20 %stenosed.  Mid LAD to Dist LAD lesion, 20 %stenosed.  The left ventricular systolic function is normal.  LV end diastolic pressure is  normal.  The left ventricular ejection fraction is greater than 65% by visual estimate.  There is no mitral valve regurgitation.  1. Mild non-obstructive CAD 2. Normal LV systolic function 3. The aortic root/ascending aorta appear to be dilated. No obvious dissection.  4. Elevated troponin could be due to PE vs coronary vasospasm vs myopericarditis.   Recommendations: Medical management of mild CAD. Given elevated troponin, would check d-dimer now to exclude PE. I would arrange a chest CTA tomorrow to assess his aortic root. This would also further exclude PE. Resume heparin drip 6 hours post sheath pull.    Disposition   Pt is being discharged home today in good condition.  Follow-up Plans & Appointments     Discharge Instructions    Diet - low sodium heart healthy    Complete by:  As directed       Discharge Medications   Discharge Medication List as of 07/14/2017  8:07 PM    START taking these medications   Details  aspirin 81 MG EC tablet Take 1 tablet (81 mg total) by mouth daily., Starting Sun 07/15/2017, No Print    clopidogrel (PLAVIX) 75 MG tablet Take 1 tablet (75 mg total) by mouth daily., Starting Sun 07/15/2017, Normal    metoprolol tartrate (LOPRESSOR) 25 MG tablet Take 1 tablet (25 mg total) by mouth 2 (two) times daily., Starting Sat 07/14/2017, Normal      CONTINUE these medications which have NOT CHANGED   Details  atorvastatin (LIPITOR) 10 MG tablet TAKE 1 TABLET BY MOUTH ONCE DAILY, Normal    blood glucose meter kit and supplies KIT Dispense based on patient and insurance preference. Test blood sugar once daily. Type 2 diabetes. E11.9, Normal    canagliflozin (INVOKANA) 300 MG TABS tablet TAKE 1 TABLET BY MOUTH EVERY DAY BEFORE BREAKFAST, Normal    fenofibrate 160 MG tablet TAKE 1 TABLET BY MOUTH ONCE DAILY, Normal    glipiZIDE (GLUCOTROL) 5 MG tablet Take 1 tablet in the am and 2 tablets in the pm, Normal    losartan (COZAAR) 50 MG tablet TAKE 1  TABLET BY MOUTH ONCE DAILY, Normal    metFORMIN (GLUCOPHAGE) 500 MG tablet TAKE 2 TABLETS BY MOUTH ONCE DAILY IN THE MORNING, 1 TABLET AT NOON, AND  2 TABLETS  IN THE EVENING, Normal    naproxen sodium (ANAPROX) 220 MG tablet Take 220 mg by mouth daily as needed (pain)., Historical Med         Aspirin prescribed at discharge?  Yes High Intensity Statin Prescribed? (Lipitor 40-13m or Crestor 20-433m: statin and fenofrate Beta Blocker Prescribed? Yes For EF <40%, was ACEI/ARB Prescribed? No: EF normal ADP Receptor Inhibitor Prescribed? (i.e. Plavix etc.-Includes Medically Managed Patients): Yes For EF <40%, Aldosterone Inhibitor Prescribed? No: EF normal Was EF assessed during THIS hospitalization? Yes Was Cardiac Rehab II ordered? (Included Medically  managed Patients): Yes   Outstanding Labs/Studies   Office will call with appt.   Duration of Discharge Encounter   Greater than 30 minutes including physician time.  Signed, Tami Lin Yoshi Vicencio PA-C 07/15/2017, 7:08 AM

## 2017-07-14 NOTE — Progress Notes (Signed)
  Echocardiogram 2D Echocardiogram has been performed.  Corey [name] 07/14/2017, 11:30 AM

## 2017-07-14 NOTE — Progress Notes (Addendum)
Progress Note  Patient Name: Corey [name] Date of Encounter: 07/14/2017  Primary Cardiologist: Dr. Jonelle Sidle (new)  Subjective   Pt is chest pain free, denies SOB, palpitations, and syncope. Would like to be discharged today.  Inpatient Medications    Scheduled Meds: . aspirin  325 mg Oral Daily  . atorvastatin  10 mg Oral Daily  . fenofibrate  160 mg Oral Daily  . Influenza vac split quadrivalent PF  0.5 mL Intramuscular Tomorrow-1000  . insulin aspart  0-9 Units Subcutaneous TID WC  . losartan  50 mg Oral Daily  . metoprolol tartrate  25 mg Oral BID  . pneumococcal 23 valent vaccine  0.5 mL Intramuscular Tomorrow-1000  . sodium chloride flush  3 mL Intravenous Q12H  . sodium chloride flush  3 mL Intravenous Q12H   Continuous Infusions: . sodium chloride    . sodium chloride    . sodium chloride    . heparin 1,500 Units/hr (07/14/17 0843)   PRN Meds: sodium chloride, sodium chloride, acetaminophen, morphine injection, ondansetron (ZOFRAN) IV, ondansetron **OR** [DISCONTINUED] ondansetron (ZOFRAN) IV, sodium chloride flush, sodium chloride flush   Vital Signs    Vitals:   07/13/17 2046 07/14/17 0612 07/14/17 0700 07/14/17 0906  BP: 123/86 116/73 (!) 132/94 121/81  Pulse: 87  79 92  Resp: 19  17   Temp: 98.5 F (36.9 C) 98 F (36.7 C)    TempSrc: Oral Oral    SpO2: 98%  98%   Weight:      Height:        Intake/Output Summary (Last 24 hours) at 07/14/17 1119 Last data filed at 07/13/17 1300  Gross per 24 hour  Intake           201.63 ml  Output                0 ml  Net           201.63 ml   Filed Weights   07/12/17 1539 07/13/17 0650  Weight: 200 lb (90.7 kg) 195 lb 15.8 oz (88.9 kg)     Physical Exam   General: Well developed, well nourished, male appearing in no acute distress. Head: Normocephalic, atraumatic.  Neck: Supple without bruits, JVD Lungs:  Resp regular and unlabored, CTA. Heart: RRR S1, S2, no murmur; no rub. Abdomen: Soft,  non-tender, non-distended with normoactive bowel sounds. No hepatomegaly. No rebound/guarding. No obvious abdominal masses. Extremities: No clubbing, cyanosis, no edema. Distal pedal pulses are 2+ bilaterally. Neuro: Alert and oriented X 3. Moves all extremities spontaneously. Psych: Normal affect.  Labs    Chemistry Recent Labs Lab 07/12/17 1729 07/14/17 0630  NA 142 138  K 4.3 4.5  CL 105 105  CO2 27 26  GLUCOSE 88 154*  BUN 10 10  CREATININE 0.94 1.05  CALCIUM 10.2 9.6  PROT  --  7.5  ALBUMIN  --  4.2  AST  --  39  ALT  --  40  ALKPHOS  --  23*  BILITOT  --  0.9  GFRNONAA >60 >60  GFRAA >60 >60  ANIONGAP 10 7     Hematology Recent Labs Lab 07/12/17 1729 07/13/17 0220 07/14/17 0630  WBC 4.2 3.8* 3.9*  RBC 5.02 4.79 5.28  HGB 13.7 13.2 14.2  HCT 40.6 38.5* 42.5  MCV 80.9 80.4 80.5  MCH 27.3 27.6 26.9  MCHC 33.7 34.3 33.4  RDW 14.1 13.9 14.0  PLT 258 238 275    Cardiac  Enzymes Recent Labs Lab 07/12/17 2352 07/13/17 0220 07/13/17 0659 07/13/17 1213  TROPONINI 0.67* 0.67* 0.59* 0.45*   No results for input(s): TROPIPOC in the last 168 hours.   DDimer  Recent Labs Lab 07/13/17 1631  DDIMER 0.32     Radiology    Dg Chest 2 View  Result Date: 07/12/2017 CLINICAL DATA:  Chest pain since last night EXAM: CHEST  2 VIEW COMPARISON:  None. FINDINGS: Heart size and mediastinal contours are within normal limits. Lungs are clear. No pleural effusion or pneumothorax seen. Elevation of the right hemidiaphragm, of uncertain acuity. Osseous and soft tissue structures about the chest are otherwise unremarkable. IMPRESSION: No active cardiopulmonary disease. No evidence of pneumonia or pulmonary edema. Electronically Signed   By: Bary Richard M.D.   On: 07/12/2017 15:56    Telemetry    Sinus rhythm - Personally reviewed.  Cardiac Studies   Left heart cath 07/13/17:  Prox Cx to Mid Cx lesion, 20 %stenosed.  Mid LAD to Dist LAD lesion, 20 %stenosed.  The  left ventricular systolic function is normal.  LV end diastolic pressure is normal.  The left ventricular ejection fraction is greater than 65% by visual estimate.  There is no mitral valve regurgitation.   1. Mild non-obstructive CAD 2. Normal LV systolic function 3. The aortic root/ascending aorta appear to be dilated. No obvious dissection.  4. Elevated troponin could be due to PE vs coronary vasospasm vs myopericarditis.   Recommendations: Medical management of mild CAD. Given elevated troponin, would check d-dimer now to exclude PE. I would arrange a chest CTA tomorrow to assess his aortic root. This would also further exclude PE. Resume heparin drip 6 hours post sheath pull.   Echocardiogram 07/14/2017: Study Conclusions  - Left ventricle: The cavity size was normal. Wall thickness was   increased in a pattern of mild LVH. Systolic function was normal.   The estimated ejection fraction was in the range of 60% to 65%.   Wall motion was normal; there were no regional wall motion   abnormalities. Doppler parameters are consistent with abnormal   left ventricular relaxation (grade 1 diastolic dysfunction). - Aortic valve: There was trivial regurgitation. - Aortic root: The aortic root was mildly ectatic. - Mitral valve: There was trivial regurgitation. - Right atrium: Central venous pressure (est): 3 mm Hg. - Atrial septum: No defect or patent foramen ovale was identified. - Tricuspid valve: There was trivial regurgitation. - Pulmonary arteries: PA peak pressure: 13 mm Hg (S). - Pericardium, extracardiac: There was no pericardial effusion.  Impressions:  - Mild LVH with LVEF 60-65% and grade 1 diastolic dysfunction.   Trivial mitral regurgitation. Trivial aortic regurgitation.   Mildly ectatic aortic root. Trivial tricuspid regurgitation with   PASP estimated 13 mmHg.  Patient Profile     51 y.o. male with a history of hypertension, type II diabetes, and  hypertriglyceridemia who is being seen for the evaluation of chest pain and abnormal troponin levels.  Assessment & Plan    1. Chest pain, NSTEMI, s/p heart cath  - troponin 0.53 --> 0.65 --> 0.67 --> 0.67 --> 0.59 --> 0.45 - heart cath yesterday with nonobstructive disease but with possible aortic root dilation; recommend D-dimer, was negative, and chest CTA to assess the aortic root (pending) - continue ASA, lipitor, fenofibrate - pt is chest pain free currently   2. DM - A1c 7.8% - SSI, per primary team   3. HTN - BP is better controlled (121/81) on  losartan 50 mg, lopressor 25 mg BID   Leonides Schanz Marcelino Duster , [place] 11:19 AM 07/14/2017 Pager: [phone]   Attending note:  Patient seen and examined. Reviewed interval chart, cardiac catheterization results as well as echocardiogram. Case discussed with Ms. Duke PA-C. Mr. Bebout reports no chest pain today. He would like to go home. Based on testing so far, would suspect possible transient plaque rupture event without evidence of subsequently documented obstructive CAD by cardiac catheterization D-dimer was normal arguing against pulmonary embolus. With normal LVEF and no significant pericardial effusion, doubt myopericarditis. Coronary vasospasm remains a possibility. His aortic root is mildly ectatic and plan is to undergo chest CTA today for further assessment prior to considering discharge. We will go ahead and stop heparin. Continue aspirin, add Plavix, continue Lipitor and fenofibrate. Also remain on regular treatment for diabetes mellitus. We will plan to see him back in the [place] office.  Jonelle Sidle, M.D., F.A.C.C.

## 2017-07-14 NOTE — Discharge Instructions (Signed)
Angiogram An angiogram is an X-ray test. It is used to look at your blood vessels. For this test, a dye is put into the blood vessel being checked. The dye shows up on X-rays. It helps your doctor see if there is a blockage or other problem in the blood vessel. What happens before the procedure?  Follow your doctor's instructions about limiting what you eat or drink.  Ask your doctor if you may drink enough water to take any needed medicines the morning of the test.  Plan to have someone take you home after the test.  If you go home the same day as the test, plan to have someone stay with you for 24 hours. What happens during the procedure?  An IV tube will be put into one of your veins.  You will be given a medicine that makes you relax (sedative).  Your skin will be washed where the thin tube (catheter) will be put in. Hair may be removed from this area. The tube may be put into: ? Your upper leg area (groin). ? The fold of your arm, near your elbow. ? Your wrist.  You will be given a medicine that numbs the area where the tube will be inserted (local anesthetic).  The tube will be inserted into a blood vessel.  Using a type of X-ray (fluoroscopy) to see, your doctor will move the tube into the blood vessel to check it.  Dye will be put in through the tube. X-rays of your blood vessels will then be taken. Different doctors and hospitals may do this procedure differently. What happens after the procedure?  If the test is done through the leg, you will be kept in bed lying flat for several hours. You will be told to not bend or cross your legs.  The area where the tube was inserted will be checked often.  The pulse in your feet or wrist will be checked often.  More tests or X-rays may be done. This information is not intended to replace advice given to you by your health care provider. Make sure you discuss any questions you have with your health care provider. Document  Released: 01/05/2009 Document Revised: 03/16/2016 Document Reviewed: 03/12/2013 Elsevier Interactive Patient Education  2017 Else[employer] Inc.   Aspirin and Your Heart Aspirin is a medicine that affects the way blood clots. Aspirin can be used to help reduce the risk of blood clots, heart attacks, and other heart-related problems. Should I take aspirin? Your health care provider will help you determine whether it is safe and beneficial for you to take aspirin daily. Taking aspirin daily may be beneficial if you:  Have had a heart attack or chest pain.  Have undergone open heart surgery such as coronary artery bypass surgery (CABG).  Have had coronary angioplasty.  Have experienced a stroke or transient ischemic attack (TIA).  Have peripheral vascular disease (PVD).  Have chronic heart rhythm problems such as atrial fibrillation.  Are there any risks of taking aspirin daily? Daily use of aspirin can increase your risk of side effects. Some of these include:  Bleeding. Bleeding problems can be minor or serious. An example of a minor problem is a cut that does not stop bleeding. An example of a more serious problem is stomach bleeding or bleeding into the brain. Your risk of bleeding is increased if you are also taking non-steroidal anti-inflammatory medicine (NSAIDs).  Increased bruising.  Upset stomach.  An allergic reaction. People who have nasal polyps  have an increased risk of developing an aspirin allergy.  What are some guidelines I should follow when taking aspirin?  Take aspirin only as directed by your health care provider. Make sure you understand how much you should take and what form you should take. The two forms of aspirin are: ? Non-enteric-coated. This type of aspirin does not have a coating and is absorbed quickly. Non-enteric-coated aspirin is usually recommended for people with chest pain. This type of aspirin also comes in a chewable form. ? Enteric-coated. This  type of aspirin has a special coating that releases the medicine very slowly. Enteric-coated aspirin causes less stomach upset than non-enteric-coated aspirin. This type of aspirin should not be chewed or crushed.  Drink alcohol in moderation. Drinking alcohol increases your risk of bleeding. When should I seek medical care?  You have unusual bleeding or bruising.  You have stomach pain.  You have an allergic reaction. Symptoms of an allergic reaction include: ? Hives. ? Itchy skin. ? Swelling of the lips, tongue, or face.  You have ringing in your ears. When should I seek immediate medical care?  Your bowel movements are bloody, dark red, or black in color.  You vomit or cough up blood.  You have blood in your urine.  You cough, wheeze, or feel short of breath. If you have any of the following symptoms, this is an emergency. Do not wait to see if the pain will go away. Get medical help at once. Call your local emergency services (911 in the [place] Do not drive yourself to the hospital.  You have severe chest pain, especially if the pain is crushing or pressure-like and spreads to the arms, back, neck, or jaw.  You have stroke-like symptoms, such as: ? Loss of vision. ? Difficulty talking. ? Numbness or weakness on one side of your body. ? Numbness or weakness in your arm or leg. ? Not thinking clearly or feeling confused.  This information is not intended to replace advice given to you by your health care provider. Make sure you discuss any questions you have with your health care provider. Document Released: 09/21/2008 Document Revised: 02/16/2016 Document Reviewed: 01/14/2014 Elsevier Interactive Patient Education  2018 [employer] Inc.   Acute Coronary Syndrome Acute coronary syndrome (ACS) is a serious problem in which there is suddenly not enough blood and oxygen supplied to the heart. ACS may mean that one or more of the blood vessels in your heart (coronary arteries)  may be blocked. ACS can result in chest pain or a heart attack (myocardial infarction or MI). What are the causes? This condition is caused by atherosclerosis, which is the buildup of fat and cholesterol (plaque) on the inside of the arteries. Over time, the plaque may narrow or block the artery, and this will lessen blood flow to the heart. Plaque can also become weak and break off within a coronary artery to form a clot and cause a sudden blockage. What increases the risk? The risk factors of this condition include:  High cholesterol levels.  High blood pressure (hypertension).  Smoking.  Diabetes.  Age.  Family history of chest pain, heart disease, or stroke.  Lack of exercise.  What are the signs or symptoms? The most common signs of this condition include:  Chest pain, which can be: ? A crushing or squeezing in the chest. ? A tightness, pressure, fullness, or heaviness in the chest. ? Present for more than a few minutes, or it can stop and recur.  Pain in the arms, neck, jaw, or back.  Unexplained heartburn or indigestion.  Shortness of breath.  Nausea.  Sudden cold sweats.  Feeling light-headed or dizzy.  Sometimes, this condition has no symptoms. How is this diagnosed? ACS may be diagnosed through the following tests:  Electrocardiogram (ECG).  Blood tests.  Coronary angiogram. This is a procedure to look at the coronary arteries to see if there is any blockage.  How is this treated? Treatment for ACS may include:  Healthy behavioral changes to reduce or control risk factors.  Medicine.  Coronary stenting.A stent helps to keep an artery open.  Coronary angioplasty. This procedure widens a narrowed or blocked artery.  Coronary artery bypass surgery. This will allow your blood to pass the blockage (bypass) to reach your heart.  Follow these instructions at home: Eating and drinking  Follow a heart-healthy diet. A dietitian can you help to  educate you about healthy food options and changes.  Use healthy cooking methods such as roasting, grilling, broiling, baking, poaching, steaming, or stir-frying. Talk to a dietitian to learn more about healthy cooking methods. Medicines  Take medicines only as directed by your health care provider.  Do not take the following medicines unless your health care provider approves: ? Nonsteroidal anti-inflammatory drugs (NSAIDs), such as ibuprofen, naproxen, or celecoxib. ? Vitamin supplements that contain vitamin A, vitamin E, or both. ? Hormone replacement therapy that contains estrogen with or without progestin.  Stop illegal drug use. Activity  Follow an exercise program that is approved by your health care provider.  Plan rest periods when you are fatigued. Lifestyle  Do not use any tobacco products, including cigarettes, chewing tobacco, or electronic cigarettes. If you need help quitting, ask your health care provider.  If you drink alcohol, and your health care provider approves, limit your alcohol intake to no more than 1 drink per day. One drink equals 12 ounces of beer, 5 ounces of wine, or 1 ounces of hard liquor.  Learn to manage stress.  Maintain a healthy weight. Lose weight as approved by your health care provider. General instructions  Manage other health conditions, such as hypertension and diabetes, as directed by your health care provider.  Keep all follow-up visits as directed by your health care provider. This is important.  Your health care provider may ask you to monitor your blood pressure. A blood pressure reading consists of a higher number over a lower number, such as 110 over 72, written as 110/72. Ideally, your blood pressure should be: ? Below 140/90 if you have no other medical conditions. ? Below 130/80 if you have diabetes or kidney disease. Get help right away if:  You have pain in your chest, neck, arm, jaw, stomach, or back that lasts more than  a few minutes, is recurring, or is not relieved by taking medicine under your tongue (sublingual nitroglycerin).  You have profuse sweating without cause.  You have unexplained: ? Heartburn or indigestion. ? Shortness of breath or difficulty breathing. ? Nausea or vomiting. ? Fatigue. ? Feelings of nervousness or anxiety. ? Weakness. ? Diarrhea.  You have sudden light-headedness or dizziness.  You faint. These symptoms may represent a serious problem that is an emergency. Do not wait to see if the symptoms will go away. Get medical help right away. Call your local emergency services (911 in the [place] Do not drive yourself to the clinic or hospital. This information is not intended to replace advice given to you by your health  care provider. Make sure you discuss any questions you have with your health care provider. Document Released: 10/09/2005 Document Revised: 03/22/2016 Document Reviewed: 02/10/2014 Elsevier Interactive Patient Education  2017 [employer] Inc.   Cardiac CT Angiogram A cardiac CT angiogram is a procedure to look at the heart and the area around the heart. It may be done to help find the cause of chest pains or other symptoms of heart disease. During this procedure, a large X-ray machine, called a CT scanner, takes detailed pictures of the heart and the surrounding area after a dye (contrast material) has been injected into blood vessels in the area. The procedure is also sometimes called a coronary CT angiogram, coronary artery scanning, or CTA. A cardiac CT angiogram allows the health care provider to see how well blood is flowing to and from the heart. The health care provider will be able to see if there are any problems, such as:  Blockage or narrowing of the coronary arteries in the heart.  Fluid around the heart.  Signs of weakness or disease in the muscles, valves, and tissues of the heart.  Tell a health care provider about:  Any allergies you have. This  is especially important if you have had a previous allergic reaction to contrast dye.  All medicines you are taking, including vitamins, herbs, eye drops, creams, and over-the-counter medicines.  Any blood disorders you have.  Any surgeries you have had.  Any medical conditions you have.  Whether you are pregnant or may be pregnant.  Any anxiety disorders, chronic pain, or other conditions you have that may increase your stress or prevent you from lying still. What are the risks? Generally, this is a safe procedure. However, problems may occur, including:  Bleeding.  Infection.  Allergic reactions to medicines or dyes.  Damage to other structures or organs.  Kidney damage from the dye or contrast that is used.  Increased risk of cancer from radiation exposure. This risk is low. Talk with your health care provider about: ? The risks and benefits of testing. ? How you can receive the lowest dose of radiation.  What happens before the procedure?  Wear comfortable clothing and remove any jewelry, glasses, dentures, and hearing aids.  Follow instructions from your health care provider about eating and drinking. This may include: ? For 12 hours before the test -- avoid caffeine. This includes tea, coffee, soda, energy drinks, and diet pills. Drink plenty of water or other fluids that do not have caffeine in them. Being well-hydrated can prevent complications. ? For 4-6 hours before the test -- stop eating and drinking. The contrast dye can cause nausea, but this is less likely if your stomach is empty.  Ask your health care provider about changing or stopping your regular medicines. This is especially important if you are taking diabetes medicines, blood thinners, or medicines to treat erectile dysfunction. What happens during the procedure?  Hair on your chest may need to be removed so that small sticky patches called electrodes can be placed on your chest. These will transmit  information that helps to monitor your heart during the test.  An IV tube will be inserted into one of your veins.  You might be given a medicine to control your heart rate during the test. This will help to ensure that good images are obtained.  You will be asked to lie on an exam table. This table will slide in and out of the CT machine during the procedure.  Contrast dye will be injected into the IV tube. You might feel warm, or you may get a metallic taste in your mouth.  You will be given a medicine (nitroglycerin) to relax (dilate) the arteries in your heart.  The table that you are lying on will move into the CT machine tunnel for the scan.  The person running the machine will give you instructions while the scans are being done. You may be asked to: ? Keep your arms above your head. ? Hold your breath. ? Stay very still, even if the table is moving.  When the scanning is complete, you will be moved out of the machine.  The IV tube will be removed. The procedure may vary among health care providers and hospitals. What happens after the procedure?  You might feel warm, or you may get a metallic taste in your mouth from the contrast dye.  You may have a headache from the nitroglycerin.  After the procedure, drink water or other fluids to wash (flush) the contrast material out of your body.  Contact a health care provider if you have any symptoms of allergy to the contrast. These symptoms include: ? Shortness of breath. ? Rash or hives. ? A racing heartbeat.  Most people can return to their normal activities right after the procedure. Ask your health care provider what activities are safe for you.  It is up to you to get the results of your procedure. Ask your health care provider, or the department that is doing the procedure, when your results will be ready. Summary  A cardiac CT angiogram is a procedure to look at the heart and the area around the heart. It may be done  to help find the cause of chest pains or other symptoms of heart disease.  During this procedure, a large X-ray machine, called a CT scanner, takes detailed pictures of the heart and the surrounding area after a dye (contrast material) has been injected into blood vessels in the area.  Ask your health care provider about changing or stopping your regular medicines before the procedure. This is especially important if you are taking diabetes medicines, blood thinners, or medicines to treat erectile dysfunction.  After the procedure, drink water or other fluids to wash (flush) the contrast material out of your body. This information is not intended to replace advice given to you by your health care provider. Make sure you discuss any questions you have with your health care provider. Document Released: 09/21/2008 Document Revised: 08/28/2016 Document Reviewed: 08/28/2016 Elsevier Interactive Patient Education  2017 [employer].

## 2017-07-14 NOTE — Progress Notes (Signed)
ANTICOAGULATION CONSULT NOTE - Follow Up Consult  Pharmacy Consult for Heparin Indication: rule out PE  No Known Allergies  Patient Measurements: Height:  (170.2 cm) Weight: 195 lb 15.8 oz (88.9 kg) IBW/kg (Calculated) : 66.1 Heparin Dosing Weight: 85.1 kg  Vital Signs: Temp: 98 F (36.7 C) (09/22 0612) Temp Source: Oral (09/22 0612) BP: 132/94 (09/22 0700) Pulse Rate: 79 (09/22 0700)  Labs:  Recent Labs  07/12/17 1729 07/12/17 1738  07/13/17 0220 07/13/17 0659 07/13/17 0935 07/13/17 1213 07/14/17 0630  HGB 13.7  --   --  13.2  --   --   --  14.2  HCT 40.6  --   --  38.5*  --   --   --  42.5  PLT 258  --   --  238  --   --   --  275  APTT  --  25  --   --   --   --   --   --   LABPROT  --  13.5  --   --   --   --   --   --   INR  --  1.04  --   --   --   --   --   --   HEPARINUNFRC  --   --   --  0.36  --  0.30  --  0.15*  CREATININE 0.94  --   --   --   --   --   --  1.05  TROPONINI 0.53*  --   < > 0.67* 0.59*  --  0.45*  --   < > = values in this interval not displayed.  Estimated Creatinine Clearance: 86.5 mL/min (by C-G formula based on SCr of 1.05 mg/dL).   Medications:  Scheduled:  . aspirin  325 mg Oral Daily  . atorvastatin  10 mg Oral Daily  . fenofibrate  160 mg Oral Daily  . Influenza vac split quadrivalent PF  0.5 mL Intramuscular Tomorrow-1000  . insulin aspart  0-9 Units Subcutaneous TID WC  . losartan  50 mg Oral Daily  . metoprolol tartrate  25 mg Oral BID  . pneumococcal 23 valent vaccine  0.5 mL Intramuscular Tomorrow-1000  . sodium chloride flush  3 mL Intravenous Q12H  . sodium chloride flush  3 mL Intravenous Q12H   Infusions:  . sodium chloride    . sodium chloride    . sodium chloride    . heparin 1,200 Units/hr (07/13/17 2249)    Assessment: 51 yo M presented to [facility] with CP and elevated troponin.  Transferred to [facility] 9/21 for cardiac cath.  Cath showed non-obstructive CAD.  Now plans for CTA to rule out PE.  Heparin was  re-started last night.  Pt was previously at low-end of heparin goal on 1150 units/hr. Heparin level this morning below goal with level <0.15. Spoke with nurse and confirmed that there were no issues with drip overnight.   Goal of Therapy:  Heparin level 0.3-0.7 units/ml Monitor platelets by anticoagulation protocol: Yes   Plan:  Heparin bolus 2550  Increase heparin drip rate to 1500 units/hr Check 6 hour heparin level  Heparin level and CBC daily Monitor for s/sx of bleeding

## 2017-07-16 ENCOUNTER — Encounter (HOSPITAL_COMMUNITY): Payer: Self-pay | Admitting: Cardiovascular Disease

## 2017-07-18 ENCOUNTER — Other Ambulatory Visit: Payer: Self-pay | Admitting: Family Medicine

## 2017-07-18 ENCOUNTER — Ambulatory Visit (INDEPENDENT_AMBULATORY_CARE_PROVIDER_SITE_OTHER): Payer: BLUE CROSS/BLUE SHIELD | Admitting: Family Medicine

## 2017-07-18 ENCOUNTER — Encounter: Payer: Self-pay | Admitting: Family Medicine

## 2017-07-18 VITALS — BP 142/90 | Ht 67.0 in | Wt 196.0 lb

## 2017-07-18 DIAGNOSIS — Z23 Encounter for immunization: Secondary | ICD-10-CM

## 2017-07-18 DIAGNOSIS — I214 Non-ST elevation (NSTEMI) myocardial infarction: Secondary | ICD-10-CM | POA: Diagnosis not present

## 2017-07-18 DIAGNOSIS — E119 Type 2 diabetes mellitus without complications: Secondary | ICD-10-CM | POA: Diagnosis not present

## 2017-07-18 DIAGNOSIS — I10 Essential (primary) hypertension: Secondary | ICD-10-CM

## 2017-07-18 NOTE — Progress Notes (Signed)
   Subjective:    Patient ID: Corey [name], male    DOB: May 04, 1966, 51 y.o.   MRN: [id number] Patient arrives office for lengthy discussion. HPIhospitalization follow up.   Wants to get flu and pneumonia vaccine.   Pt recently hospitalized for chest pain  Had elev troponin level times three  Saw card and had full wu     Full hospital record reviewed in presence of patient. Seen here. Since emergency room with concerning chest pain with pressure shortness of breath. Troponin levels substantially elevated 3.  Was admitted to the hospital. The following day transferred for catheterization.  Cath revealed no major coronary artery disease. Patient's experience was felt to be a true non-ST elevated myocardial infarction associated with spasm and/or myocarditis.  Patient also had dilated aortic root this was followed up with a CTA which revealed ectasia which needs to be followed.  A1c in the hospital high sevens   Review of Systems No headache, no major weight loss or weight gain, no chest pain no back pain abdominal pain no change in bowel habits complete ROS otherwise negative     Objective:   Physical Exam  Alert and oriented, vitals reviewed and stable, NAD ENT-TM's and ext canals WNL bilat via otoscopic exam Soft palate, tonsils and post pharynx WNL via oropharyngeal exam Neck-symmetric, no masses; thyroid nonpalpable and nontender Pulmonary-no tachypnea or accessory muscle use; Clear without wheezes via auscultation Card--no abnrml murmurs, rhythm reg and rate WNL Carotid pulses symmetric, without bruits       Assessment & Plan:  Impression non-ST MI with numerous risk factors. Ectasia of aorta. Very long discussion held. Will ask patient to redouble his efforts on diet and exercise and compliance with medicines. Recheck in 3 months. If A1c still elevated will need to take further steps.Also will reassess lipid profile and blood pressure management. To maintain beta  blockers as prescribed

## 2017-07-19 ENCOUNTER — Other Ambulatory Visit: Payer: Self-pay | Admitting: Family Medicine

## 2017-08-15 DIAGNOSIS — H25813 Combined forms of age-related cataract, bilateral: Secondary | ICD-10-CM | POA: Diagnosis not present

## 2017-08-15 DIAGNOSIS — H524 Presbyopia: Secondary | ICD-10-CM | POA: Diagnosis not present

## 2017-08-15 DIAGNOSIS — H5213 Myopia, bilateral: Secondary | ICD-10-CM | POA: Diagnosis not present

## 2017-08-15 DIAGNOSIS — H52223 Regular astigmatism, bilateral: Secondary | ICD-10-CM | POA: Diagnosis not present

## 2017-08-15 LAB — HM DIABETES EYE EXAM

## 2017-08-21 ENCOUNTER — Other Ambulatory Visit: Payer: Self-pay | Admitting: Family Medicine

## 2017-08-22 ENCOUNTER — Other Ambulatory Visit: Payer: Self-pay | Admitting: Family Medicine

## 2017-09-24 ENCOUNTER — Other Ambulatory Visit: Payer: Self-pay | Admitting: Family Medicine

## 2017-11-16 ENCOUNTER — Ambulatory Visit: Payer: BLUE CROSS/BLUE SHIELD | Admitting: Family Medicine

## 2017-12-12 ENCOUNTER — Other Ambulatory Visit: Payer: Self-pay | Admitting: Family Medicine

## 2017-12-12 ENCOUNTER — Telehealth: Payer: Self-pay | Admitting: Family Medicine

## 2017-12-12 DIAGNOSIS — I10 Essential (primary) hypertension: Secondary | ICD-10-CM

## 2017-12-12 DIAGNOSIS — Z13220 Encounter for screening for lipoid disorders: Secondary | ICD-10-CM

## 2017-12-12 DIAGNOSIS — E119 Type 2 diabetes mellitus without complications: Secondary | ICD-10-CM

## 2017-12-12 NOTE — Telephone Encounter (Signed)
Rep same plus urine protein test

## 2017-12-12 NOTE — Telephone Encounter (Signed)
Pt is requesting lab orders to be sent over for an upcoming appt. Last labs by us per Epic were: psa,a1c,bmp,hepatic and lipid on 03/01/2017.

## 2017-12-12 NOTE — Telephone Encounter (Signed)
Labs entered and pt aware

## 2017-12-14 ENCOUNTER — Other Ambulatory Visit: Payer: Self-pay | Admitting: *Deleted

## 2017-12-14 DIAGNOSIS — E119 Type 2 diabetes mellitus without complications: Secondary | ICD-10-CM | POA: Diagnosis not present

## 2017-12-14 DIAGNOSIS — E785 Hyperlipidemia, unspecified: Secondary | ICD-10-CM

## 2017-12-14 DIAGNOSIS — Z13220 Encounter for screening for lipoid disorders: Secondary | ICD-10-CM | POA: Diagnosis not present

## 2017-12-14 DIAGNOSIS — I10 Essential (primary) hypertension: Secondary | ICD-10-CM | POA: Diagnosis not present

## 2017-12-14 DIAGNOSIS — Z79899 Other long term (current) drug therapy: Secondary | ICD-10-CM

## 2017-12-14 DIAGNOSIS — Z125 Encounter for screening for malignant neoplasm of prostate: Secondary | ICD-10-CM

## 2017-12-15 LAB — HEPATIC FUNCTION PANEL
ALBUMIN: 4.8 g/dL (ref 3.5–5.5)
ALK PHOS: 26 IU/L — AB (ref 39–117)
ALT: 35 IU/L (ref 0–44)
AST: 26 IU/L (ref 0–40)
Bilirubin Total: 0.5 mg/dL (ref 0.0–1.2)
Bilirubin, Direct: 0.18 mg/dL (ref 0.00–0.40)
Total Protein: 7.9 g/dL (ref 6.0–8.5)

## 2017-12-15 LAB — BASIC METABOLIC PANEL
BUN / CREAT RATIO: 9 (ref 9–20)
BUN: 9 mg/dL (ref 6–24)
CO2: 20 mmol/L (ref 20–29)
Calcium: 10.3 mg/dL — ABNORMAL HIGH (ref 8.7–10.2)
Chloride: 107 mmol/L — ABNORMAL HIGH (ref 96–106)
Creatinine, Ser: 0.97 mg/dL (ref 0.76–1.27)
GFR calc Af Amer: 103 mL/min/{1.73_m2} (ref >59)
GFR, EST NON AFRICAN AMERICAN: 89 mL/min/{1.73_m2} (ref >59)
Glucose: 133 mg/dL — ABNORMAL HIGH (ref 65–99)
POTASSIUM: 4.8 mmol/L (ref 3.5–5.2)
SODIUM: 143 mmol/L (ref 134–144)

## 2017-12-15 LAB — LIPID PANEL
CHOLESTEROL TOTAL: 161 mg/dL (ref 100–199)
Chol/HDL Ratio: 6 ratio — ABNORMAL HIGH (ref 0.0–5.0)
HDL: 27 mg/dL — ABNORMAL LOW (ref >39)
LDL Calculated: 104 mg/dL — ABNORMAL HIGH (ref 0–99)
TRIGLYCERIDES: 149 mg/dL (ref 0–149)
VLDL Cholesterol Cal: 30 mg/dL (ref 5–40)

## 2017-12-15 LAB — PROTEIN / CREATININE RATIO, URINE
CREATININE, UR: 58.5 mg/dL
PROTEIN/CREAT RATIO: 126 mg/g{creat} (ref 0–200)
Protein, Ur: 7.4 mg/dL

## 2017-12-15 LAB — HEMOGLOBIN A1C
Est. average glucose Bld gHb Est-mCnc: 157 mg/dL
Hgb A1c MFr Bld: 7.1 % — ABNORMAL HIGH (ref 4.8–5.6)

## 2017-12-15 LAB — PSA: Prostate Specific Ag, Serum: 0.5 ng/mL (ref 0.0–4.0)

## 2017-12-20 ENCOUNTER — Ambulatory Visit: Payer: BLUE CROSS/BLUE SHIELD | Admitting: Family Medicine

## 2017-12-20 ENCOUNTER — Encounter: Payer: Self-pay | Admitting: Family Medicine

## 2017-12-20 VITALS — BP 132/82 | Ht 67.0 in | Wt 200.4 lb

## 2017-12-20 DIAGNOSIS — E119 Type 2 diabetes mellitus without complications: Secondary | ICD-10-CM

## 2017-12-20 DIAGNOSIS — E785 Hyperlipidemia, unspecified: Secondary | ICD-10-CM

## 2017-12-20 DIAGNOSIS — I214 Non-ST elevation (NSTEMI) myocardial infarction: Secondary | ICD-10-CM

## 2017-12-20 MED ORDER — METFORMIN HCL 500 MG PO TABS: ORAL_TABLET | ORAL | 5 refills | Status: DC

## 2017-12-20 MED ORDER — FENOFIBRATE 160 MG PO TABS: 160.0000 mg | ORAL_TABLET | Freq: Every day | ORAL | 1 refills | Status: DC

## 2017-12-20 MED ORDER — CANAGLIFLOZIN 300 MG PO TABS: ORAL_TABLET | ORAL | 1 refills | Status: DC

## 2017-12-20 MED ORDER — ATORVASTATIN CALCIUM 20 MG PO TABS: 20.0000 mg | ORAL_TABLET | Freq: Every day | ORAL | 1 refills | Status: DC

## 2017-12-20 MED ORDER — GLIPIZIDE 5 MG PO TABS: ORAL_TABLET | ORAL | 1 refills | Status: DC

## 2017-12-20 MED ORDER — LOSARTAN POTASSIUM 50 MG PO TABS: 50.0000 mg | ORAL_TABLET | Freq: Every day | ORAL | 5 refills | Status: DC

## 2017-12-20 NOTE — Progress Notes (Signed)
Subjective:    Patient ID: Corey [name], male    DOB: 09/07/1966, 52 y.o.   MRN: [id number]   Diabetes   He presents for his follow-up diabetic visit. He has type 2 diabetes mellitus. Risk factors for coronary artery disease include dyslipidemia, hypertension and diabetes mellitus. Current diabetic treatment includes oral agent (triple therapy). He is compliant with treatment all of the time. His weight is stable. He is following a diabetic diet. He  has not had a previous visit with a dietitian. He  does not see a podiatrist. Eye exam is current.   Results for orders placed or performed in visit on 12/12/17  Protein / creatinine ratio, urine  Result Value Ref Range   Creatinine, Urine 58.5 Not Estab. mg/dL   Protein, Ur 7.4 Not Estab. mg/dL   Protein/Creat Ratio 119 0 - 200 mg/g creat  Lipid Profile  Result Value Ref Range   Cholesterol, Total 161 100 - 199 mg/dL   Triglycerides 147 0 - 149 mg/dL   HDL 27 (L) >82 mg/dL   VLDL Cholesterol Cal 30 5 - 40 mg/dL   LDL Calculated 956 (H) 0 - 99 mg/dL   Chol/HDL Ratio 6.0 (H) 0.0 - 5.0 ratio  Hepatic function panel  Result Value Ref Range   Total Protein 7.9 6.0 - 8.5 g/dL   Albumin 4.8 3.5 - 5.5 g/dL   Bilirubin Total 0.5 0.0 - 1.2 mg/dL   Bilirubin, Direct 2.13 0.00 - 0.40 mg/dL   Alkaline Phosphatase 26 (L) 39 - 117 IU/L   AST 26 0 - 40 IU/L   ALT 35 0 - 44 IU/L  Basic Metabolic Panel (BMET)  Result Value Ref Range   Glucose 133 (H) 65 - 99 mg/dL   BUN 9 6 - 24 mg/dL   Creatinine, Ser 0.86 0.76 - 1.27 mg/dL   GFR calc non Af Amer 89 >59 mL/min/1.73   GFR calc Af Amer 103 >59 mL/min/1.73   BUN/Creatinine Ratio 9 9 - 20   Sodium 143 134 - 144 mmol/L   Potassium 4.8 3.5 - 5.2 mmol/L   Chloride 107 (H) 96 - 106 mmol/L   CO2 20 20 - 29 mmol/L   Calcium 10.3 (H) 8.7 - 10.2 mg/dL  HgB V7Q  Result Value Ref Range   Hgb A1c MFr Bld 7.1 (H) 4.8 - 5.6 %   Est. average glucose Bld gHb Est-mCnc 157 mg/dL  PSA  Result Value Ref Range     Prostate Specific Ag, Serum 0.5 0.0 - 4.0 ng/mL   Blood pressure medicine and blood pressure levels reviewed today with patient. Compliant with blood pressure medicine. States does not miss a dose. No obvious side effects. Blood pressure generally good when checked elsewhere. Watching salt intake.  Patient continues to take lipid medication regularly. No obvious side effects from it. Generally does not miss a dose. Prior blood work results are reviewed with patient. Patient continues to work on fat intake in diet  Patient claims compliance with diabetes medication. No obvious side effects. Reports no substantial low sugar spells. Most numbers are generally in good range when checked fasting. Generally does not miss a dose of medication. Watching diabetic diet closely    Patient notes periods of feeling down at times.  Concerned that he cannot provide as much financially per his family as he had hoped.  Feels he is not actually depressed.  Definitely does not want any medications.  Frustrated not paying more attention to his health  now that he is been diagnosed with coronary artery disease   Review of Systems No headache, no major weight loss or weight gain, no chest pain no back pain abdominal pain no change in bowel habits complete ROS otherwise negative     Objective:   Physical Exam  Alert and oriented, vitals reviewed and stable, NAD ENT-TM's and ext canals WNL bilat via otoscopic exam Soft palate, tonsils and post pharynx WNL via oropharyngeal exam Neck-symmetric, no masses; thyroid nonpalpable and nontender Pulmonary-no tachypnea or accessory muscle use; Clear without wheezes via auscultation Card--no abnrml murmurs, rhythm reg and rate WNL Carotid pulses symmetric, without bruits       Assessment & Plan:  Impression 1 hypertension #2Coronary artery disease importance of controlling risk factors discussed at great length.Peri [place].  Good control.  Discussed.  Maintain same meds  3.   Type 2 diabetes fair control not perfect.  A1c tolerable will maintain same  4.  Hyperlipidemia.  Unsatisfactory with problem #2 discussed will increase Lipitor plan follow-up in 4 months.  Reassessment pending wellness plus chronic

## 2018-03-25 ENCOUNTER — Other Ambulatory Visit: Payer: Self-pay | Admitting: Family Medicine

## 2018-04-09 ENCOUNTER — Telehealth: Payer: Self-pay | Admitting: Family Medicine

## 2018-04-09 NOTE — Telephone Encounter (Signed)
Patient last had labs drawn 12/14/2017 Lip,hep,micro/cr urine,bmet,a1c,psa.Please advise.

## 2018-04-09 NOTE — Telephone Encounter (Signed)
Pt would like lab orders placed for his physical scheduled on 8/5. He would like to get those done before the cpe.

## 2018-04-11 ENCOUNTER — Other Ambulatory Visit: Payer: Self-pay | Admitting: Family Medicine

## 2018-04-11 DIAGNOSIS — Z79899 Other long term (current) drug therapy: Secondary | ICD-10-CM

## 2018-04-11 DIAGNOSIS — I10 Essential (primary) hypertension: Secondary | ICD-10-CM

## 2018-04-11 DIAGNOSIS — E119 Type 2 diabetes mellitus without complications: Secondary | ICD-10-CM

## 2018-04-11 DIAGNOSIS — E785 Hyperlipidemia, unspecified: Secondary | ICD-10-CM

## 2018-04-11 NOTE — Telephone Encounter (Signed)
Labs placed; left message to return call. 

## 2018-04-11 NOTE — Telephone Encounter (Signed)
Lip liv A1c 

## 2018-04-12 NOTE — Telephone Encounter (Signed)
Patient is aware 

## 2018-05-27 ENCOUNTER — Encounter: Payer: BLUE CROSS/BLUE SHIELD | Admitting: Family Medicine

## 2018-06-14 DIAGNOSIS — E119 Type 2 diabetes mellitus without complications: Secondary | ICD-10-CM | POA: Diagnosis not present

## 2018-06-14 DIAGNOSIS — E785 Hyperlipidemia, unspecified: Secondary | ICD-10-CM | POA: Diagnosis not present

## 2018-06-14 DIAGNOSIS — Z79899 Other long term (current) drug therapy: Secondary | ICD-10-CM | POA: Diagnosis not present

## 2018-06-15 LAB — LIPID PANEL
CHOL/HDL RATIO: 6.1 ratio — AB (ref 0.0–5.0)
CHOLESTEROL TOTAL: 134 mg/dL (ref 100–199)
HDL: 22 mg/dL — AB (ref >39)
LDL CALC: 51 mg/dL (ref 0–99)
TRIGLYCERIDES: 304 mg/dL — AB (ref 0–149)
VLDL Cholesterol Cal: 61 mg/dL — ABNORMAL HIGH (ref 5–40)

## 2018-06-15 LAB — HEPATIC FUNCTION PANEL
ALBUMIN: 4.9 g/dL (ref 3.5–5.5)
ALT: 35 IU/L (ref 0–44)
AST: 30 IU/L (ref 0–40)
Alkaline Phosphatase: 29 IU/L — ABNORMAL LOW (ref 39–117)
Bilirubin Total: 0.6 mg/dL (ref 0.0–1.2)
Bilirubin, Direct: 0.21 mg/dL (ref 0.00–0.40)
TOTAL PROTEIN: 8.1 g/dL (ref 6.0–8.5)

## 2018-06-15 LAB — HEMOGLOBIN A1C
ESTIMATED AVERAGE GLUCOSE: 183 mg/dL
HEMOGLOBIN A1C: 8 % — AB (ref 4.8–5.6)

## 2018-06-20 ENCOUNTER — Ambulatory Visit (INDEPENDENT_AMBULATORY_CARE_PROVIDER_SITE_OTHER): Payer: BLUE CROSS/BLUE SHIELD | Admitting: Family Medicine

## 2018-06-20 ENCOUNTER — Encounter: Payer: Self-pay | Admitting: Family Medicine

## 2018-06-20 VITALS — BP 158/88 | Ht 66.25 in | Wt 200.0 lb

## 2018-06-20 DIAGNOSIS — Z23 Encounter for immunization: Secondary | ICD-10-CM

## 2018-06-20 DIAGNOSIS — Z0000 Encounter for general adult medical examination without abnormal findings: Secondary | ICD-10-CM | POA: Diagnosis not present

## 2018-06-20 DIAGNOSIS — I10 Essential (primary) hypertension: Secondary | ICD-10-CM

## 2018-06-20 DIAGNOSIS — Q254 Congenital malformation of aorta unspecified: Secondary | ICD-10-CM | POA: Diagnosis not present

## 2018-06-20 DIAGNOSIS — E119 Type 2 diabetes mellitus without complications: Secondary | ICD-10-CM

## 2018-06-20 MED ORDER — FENOFIBRATE 160 MG PO TABS: 160.0000 mg | ORAL_TABLET | Freq: Every day | ORAL | 1 refills | Status: DC

## 2018-06-20 MED ORDER — LOSARTAN POTASSIUM 50 MG PO TABS: 50.0000 mg | ORAL_TABLET | Freq: Every day | ORAL | 5 refills | Status: DC

## 2018-06-20 MED ORDER — METFORMIN HCL 500 MG PO TABS: ORAL_TABLET | ORAL | 5 refills | Status: DC

## 2018-06-20 MED ORDER — CANAGLIFLOZIN 300 MG PO TABS: ORAL_TABLET | ORAL | 1 refills | Status: DC

## 2018-06-20 MED ORDER — ATORVASTATIN CALCIUM 10 MG PO TABS: 10.0000 mg | ORAL_TABLET | Freq: Every day | ORAL | 1 refills | Status: DC

## 2018-06-20 MED ORDER — GLIPIZIDE 5 MG PO TABS: ORAL_TABLET | ORAL | 1 refills | Status: DC

## 2018-06-20 NOTE — Progress Notes (Signed)
Subjective:    Patient ID: Corey [name], male    DOB: 06/19/1966, 52 y.o.   MRN: [id number]  HPI  The patient comes in today for a wellness visit.    A review of their health history was completed.  A review of medications was also completed.  Any needed refills; No  Eating habits: Good  Falls/  MVA accidents in past few months: None  Regular exercise: Occasionally  Specialist pt sees on regular basis: None  Preventative health issues were discussed.   Additional concerns: None  Patient is here today also to discuss chronic health issues. Last A1c was done on 06/14/2018 at 8.0.  Results for orders placed or performed in visit on 04/11/18  Hemoglobin A1c  Result Value Ref Range   Hgb A1c MFr Bld 8.0 (H) 4.8 - 5.6 %   Est. average glucose Bld gHb Est-mCnc 183 mg/dL  Hepatic function panel  Result Value Ref Range   Total Protein 8.1 6.0 - 8.5 g/dL   Albumin 4.9 3.5 - 5.5 g/dL   Bilirubin Total 0.6 0.0 - 1.2 mg/dL   Bilirubin, Direct 8.650.21 0.00 - 0.40 mg/dL   Alkaline Phosphatase 29 (L) 39 - 117 IU/L   AST 30 0 - 40 IU/L   ALT 35 0 - 44 IU/L  Lipid Profile  Result Value Ref Range   Cholesterol, Total 134 100 - 199 mg/dL   Triglycerides 784304 (H) 0 - 149 mg/dL   HDL 22 (L) >69>39 mg/dL   VLDL Cholesterol Cal 61 (H) 5 - 40 mg/dL   LDL Calculated 51 0 - 99 mg/dL   Chol/HDL Ratio 6.1 (H) 0.0 - 5.0 ratio   Patient claims compliance with diabetes medication. No obvious side effects. Reports no substantial low sugar spells. Most numbers are generally in good range when checked fasting. Generally does not miss a dose of medication. Watching diabetic diet closely  Blood pressure medicine and blood pressure levels reviewed today with patient. Compliant with blood pressure medicine. States does not miss a dose. No obvious side effects. Blood pressure generally good when checked elsewhere. Watching salt intake.  Patient continues to take lipid medication regularly. No obvious side  effects from it. Generally does not miss a dose. Prior blood work results are reviewed with patient. Patient continues to work on fat intake in diet    Patient also had thoracic aorta ectasia.  One year ago it was advised that this be rescan.  Discussed at length with patient today.  Patient in agreement with this  Feeling good , no tired ness,   Not exdrcising these days  Pt notes glu has been on the hoigh side  Notes diet not the best these days  Review of Systems No headache, no major weight loss or weight gain, no chest pain no back pain abdominal pain no change in bowel habits complete ROS otherwise negative     Objective:   Physical Exam  Constitutional: He appears well-developed and well-nourished.  HENT:  Head: Normocephalic and atraumatic.  Right Ear: External ear normal.  Left Ear: External ear normal.  Nose: Nose normal.  Mouth/Throat: Oropharynx is clear and moist.  Eyes: Pupils are equal, round, and reactive to light. EOM are normal.  Neck: Normal range of motion. Neck supple. No thyromegaly present.  Cardiovascular: Normal rate, regular rhythm and normal heart sounds.  No murmur heard. Pulmonary/Chest: Effort normal and breath sounds normal. No respiratory distress. He has no wheezes.  Abdominal: Soft. Bowel sounds are  normal. He exhibits no distension and no mass. There is no tenderness.  Genitourinary: Penis normal.  Musculoskeletal: Normal range of motion. He exhibits no edema.  Lymphadenopathy:    He has no cervical adenopathy.  Neurological: He is alert. He exhibits normal muscle tone.  Skin: Skin is warm and dry. No erythema.  Psychiatric: He has a normal mood and affect. His behavior is normal. Judgment normal.  Vitals reviewed.         Assessment & Plan:  Impression wellness exam.  Up-to-date on colonoscopy per patient.  Diet discussed.  Exercise discussed.  Vaccines discussed.  2.  Type 2 diabetes.  Good A1c.  Discussed to maintain same  therapy  3.  Hypertension.  Blood pressure good on repeat.  Patient maintain same discussed  4.  Hyperlipidemia.  Prior blood work reviewed.  Patient maintain same  5.  Thoracic aorta ectasia.  Discussed.  Time for a repeat scan rationale discussed we will work on setting up  Follow-up in 6 months diet exercise discussed medications refilled

## 2018-06-27 ENCOUNTER — Telehealth: Payer: Self-pay | Admitting: Family Medicine

## 2018-06-27 DIAGNOSIS — I10 Essential (primary) hypertension: Secondary | ICD-10-CM

## 2018-06-27 NOTE — Telephone Encounter (Addendum)
Blood work ordered in Epic. Patient notified. 

## 2018-06-27 NOTE — Telephone Encounter (Signed)
Pt needs to have a BMP done before his Chest CT due to contrast  Please order & notify pt when lab orders are ready  (CT is scheduled for 07/15/18, arrive [facility] 2:45pm & only liquids 4 hours prior to scan - pt aware)

## 2018-07-02 ENCOUNTER — Telehealth: Payer: Self-pay | Admitting: Family Medicine

## 2018-07-02 DIAGNOSIS — I10 Essential (primary) hypertension: Secondary | ICD-10-CM | POA: Diagnosis not present

## 2018-07-02 NOTE — Telephone Encounter (Signed)
Please sign order for CT chest to be done at [facility] (pt prefers this location due to his out of pocket cost)  Forward to Gerald to fax (fx# [phone]) Scheduled for Monday, 07/08/18  In green folder in yellow box

## 2018-07-03 LAB — BASIC METABOLIC PANEL
BUN/Creatinine Ratio: 12 (ref 9–20)
BUN: 12 mg/dL (ref 6–24)
CHLORIDE: 104 mmol/L (ref 96–106)
CO2: 21 mmol/L (ref 20–29)
CREATININE: 1 mg/dL (ref 0.76–1.27)
Calcium: 10.3 mg/dL — ABNORMAL HIGH (ref 8.7–10.2)
GFR calc Af Amer: 98 mL/min/{1.73_m2} (ref >59)
GFR calc non Af Amer: 85 mL/min/{1.73_m2} (ref >59)
GLUCOSE: 141 mg/dL — AB (ref 65–99)
POTASSIUM: 4.3 mmol/L (ref 3.5–5.2)
Sodium: 141 mmol/L (ref 134–144)

## 2018-07-05 ENCOUNTER — Other Ambulatory Visit: Payer: Self-pay

## 2018-07-05 DIAGNOSIS — Q254 Congenital malformation of aorta unspecified: Secondary | ICD-10-CM

## 2018-07-08 DIAGNOSIS — I712 Thoracic aortic aneurysm, without rupture: Secondary | ICD-10-CM | POA: Diagnosis not present

## 2018-07-09 ENCOUNTER — Telehealth: Payer: Self-pay | Admitting: Family Medicine

## 2018-07-09 NOTE — Telephone Encounter (Signed)
Patient calling in regards of missing a call from us yesterday. Advised patient I would check with nurses to see and then they would return his call.

## 2018-07-09 NOTE — Telephone Encounter (Signed)
Nurse spoke with pt. See result note

## 2018-07-11 ENCOUNTER — Telehealth: Payer: Self-pay | Admitting: *Deleted

## 2018-07-11 DIAGNOSIS — Q254 Congenital malformation of aorta unspecified: Secondary | ICD-10-CM

## 2018-07-11 NOTE — Telephone Encounter (Signed)
Patient advised per Dr Brett CanalesSteve The aorta has now slightly dilated further to 4.1 cm, only 0.2 expansion in last yr, however, now greater than 4 that's when Dr Brett CanalesSteve usually rec a vascular surgery referral to discuss with expert potential for need for further intervention down the road . Patient verbalized understanding. Referral ordered in Epic.

## 2018-07-11 NOTE — Addendum Note (Signed)
Addended by: Margaretha SheffieldBROWN, Kaius Daino S on: 07/11/2018 02:50 PM   Modules accepted: Orders

## 2018-07-11 NOTE — Telephone Encounter (Signed)
Left message to return call 

## 2018-07-11 NOTE — Telephone Encounter (Signed)
The aorta has now slightly dilated further to 4.1 cm, only 0.2 expansion in last yr, however, now greater than 4 that's when I usually rfec a vasc surgf referral to disc with expert potential for need for further intervention down the road . Referral plz

## 2018-07-11 NOTE — Telephone Encounter (Signed)
Scan results from outside facility

## 2018-07-12 ENCOUNTER — Encounter: Payer: Self-pay | Admitting: Family Medicine

## 2018-07-15 ENCOUNTER — Ambulatory Visit (HOSPITAL_COMMUNITY): Payer: BLUE CROSS/BLUE SHIELD

## 2018-07-22 ENCOUNTER — Encounter: Payer: Self-pay | Admitting: Vascular Surgery

## 2018-07-22 ENCOUNTER — Ambulatory Visit (INDEPENDENT_AMBULATORY_CARE_PROVIDER_SITE_OTHER): Payer: BLUE CROSS/BLUE SHIELD | Admitting: Vascular Surgery

## 2018-07-22 VITALS — BP 156/101 | HR 103 | Temp 96.0°F | Resp 20 | Ht 66.0 in | Wt 203.0 lb

## 2018-07-22 DIAGNOSIS — I712 Thoracic aortic aneurysm, without rupture, unspecified: Secondary | ICD-10-CM

## 2018-07-22 NOTE — Progress Notes (Signed)
Vascular and Vein Specialist of [facility]  Patient name: Corey [name] MRN: [id number] DOB: 1966/01/25 Sex: male  REASON FOR CONSULT: Evaluation of thoracic aneurysm  Seen today in our [place] office   HPI: Corey [name] is a 52 y.o. male, who is seen today for discussion of his known thoracic aneurysm.  He is here today with his wife.  A pleasant 48 year old gentleman with a known history of thoracic aneurysm.  This is been followed with serial CT scans.  He is here today for discussion of this.  He had a recent scan last week at [facility].  I have the results of this but not the actual images.  This is in comparison to a CT scan he had in the [facility] system 1 year ago.  He has no symptoms related to his aneurysm.  Does have a history of hypertension.  Does not have any family history of known aneurysmal disease  Past Medical History:  Diagnosis Date  . Essential hypertension   . Hypertriglyceridemia   . Type 2 diabetes mellitus (HCC)     Family History  Problem Relation Age of Onset  . Colon cancer Other   . Cancer Father   . Hypertension Father   . Diabetes Father   . Heart attack Father   . Colon cancer Maternal Uncle     SOCIAL HISTORY: Social History   Socioeconomic History  . Marital status: Married    Spouse name: Not on file  . Number of children: Not on file  . Years of education: Not on file  . Highest education level: Not on file  Occupational History  . Not on file  Social Needs  . Financial resource strain: Not on file  . Food insecurity:    Worry: Not on file    Inability: Not on file  . Transportation needs:    Medical: Not on file    Non-medical: Not on file  Tobacco Use  . Smoking status: Former Smoker    Packs/day: 0.50    Years: 10.00    Pack years: 5.00    Types: Cigarettes    Last attempt to quit: 02/07/1993    Years since quitting: 25.4  . Smokeless tobacco: Never Used  Substance and Sexual Activity  .  Alcohol use: Yes    Comment: occasionally  . Drug use: No  . Sexual activity: Yes  Lifestyle  . Physical activity:    Days per week: Not on file    Minutes per session: Not on file  . Stress: Not on file  Relationships  . Social connections:    Talks on phone: Not on file    Gets together: Not on file    Attends religious service: Not on file    Active member of club or organization: Not on file    Attends meetings of clubs or organizations: Not on file    Relationship status: Not on file  . Intimate partner violence:    Fear of current or ex partner: Not on file    Emotionally abused: Not on file    Physically abused: Not on file    Forced sexual activity: Not on file  Other Topics Concern  . Not on file  Social History Narrative  . Not on file    No Known Allergies  Current Outpatient Medications  Medication Sig Dispense Refill  . aspirin 81 MG EC tablet Take 1 tablet (81 mg total) by mouth daily.    Marland Kitchen atorvastatin (  LIPITOR) 10 MG tablet Take 1 tablet (10 mg total) by mouth daily. 90 tablet 1  . blood glucose meter kit and supplies KIT Dispense based on patient and insurance preference. Test blood sugar once daily. Type 2 diabetes. E11.9 1 each 0  . canagliflozin (INVOKANA) 300 MG TABS tablet TAKE 1 TABLET BY MOUTH ONCE DAILY BEFORE BREAKFAST 90 tablet 1  . fenofibrate 160 MG tablet Take 1 tablet (160 mg total) by mouth daily. 90 tablet 1  . glipiZIDE (GLUCOTROL) 5 MG tablet Take 1 tablet in the am and 2 tablets in the pm 270 tablet 1  . losartan (COZAAR) 50 MG tablet Take 1 tablet (50 mg total) by mouth daily. 30 tablet 5  . metFORMIN (GLUCOPHAGE) 500 MG tablet TAKE 2 TABLETS BY MOUTH ONCE DAILY IN THE MORNING, 1 TABLET AT NOON, AND  2 TABLETS  IN THE EVENING 150 tablet 5  . metoprolol tartrate (LOPRESSOR) 25 MG tablet Take 1 tablet (25 mg total) by mouth 2 (two) times daily. 60 tablet 11   No current facility-administered medications for this visit.     REVIEW OF  SYSTEMS:  '[X]'  denotes positive finding, '[ ]'  denotes negative finding Cardiac  Comments:  Chest pain or chest pressure:    Shortness of breath upon exertion:    Short of breath when lying flat:    Irregular heart rhythm:        Vascular    Pain in calf, thigh, or hip brought on by ambulation:    Pain in feet at night that wakes you up from your sleep:     Blood clot in your veins:    Leg swelling:         Pulmonary    Oxygen at home:    Productive cough:     Wheezing:         Neurologic    Sudden weakness in arms or legs:     Sudden numbness in arms or legs:     Sudden onset of difficulty speaking or slurred speech:    Temporary loss of vision in one eye:     Problems with dizziness:         Gastrointestinal    Blood in stool:     Vomited blood:         Genitourinary    Burning when urinating:     Blood in urine:        Psychiatric    Major depression:         Hematologic    Bleeding problems:    Problems with blood clotting too easily:        Skin    Rashes or ulcers:        Constitutional    Fever or chills:      PHYSICAL EXAM: Vitals:   07/22/18 1004 07/22/18 1009  BP: (!) 166/98 (!) 156/101  Pulse: (!) 104 (!) 103  Resp: 20   Temp: (!) 96 F (35.6 C)   TempSrc: Temporal   Weight: 203 lb (92.1 kg)   Height: '5\' 6"'  (1.676 m)     GENERAL: The patient is a well-nourished male, in no acute distress. The vital signs are documented above. CARDIOVASCULAR: Carotid arteries without bruits bilaterally.  2+ dorsalis pedis pulses bilaterally.  Does not have a cardiac murmur. PULMONARY: There is good air exchange  ABDOMEN: Soft and non-tender  MUSCULOSKELETAL: There are no major deformities or cyanosis. NEUROLOGIC: No focal weakness or paresthesias are detected. SKIN:  There are no ulcers or rashes noted. PSYCHIATRIC: The patient has a normal affect.  DATA: CT scan shows maximal diameter of his a sending aorta at 4.1 cm and ascending aorta of 3.1 cm.  This  compares to 1 year ago where his maximal ascending aorta was 3.9 cm  MEDICAL ISSUES: I discussed the significance of this with the patient and his wife.  I explained the anatomy of the ascending arch and also descending thoracic aorta.  3 with serial CT scan to rule out aorta to a dangerous level.  I did explain that a cardiac surgery would treat the a sending aorta and would recommend follow-up with them with a repeat CT scan in 1 year.  We will coordinate this.  Will follow-up with vascular on an as-needed basis   Rosetta Posner, MD [facility] Vascular and Ve[facility] Specialists of [facility] Tel [phone] Pager [phone]

## 2018-07-23 ENCOUNTER — Other Ambulatory Visit: Payer: Self-pay

## 2018-07-23 DIAGNOSIS — I712 Thoracic aortic aneurysm, without rupture, unspecified: Secondary | ICD-10-CM

## 2018-10-14 IMAGING — DX DG CHEST 2V
2 series · 2 of 2 positions shown · non-contrast
Comparison: None.

CLINICAL DATA: Chest pain since last night

EXAM:
CHEST  2 VIEW

[chest pa]
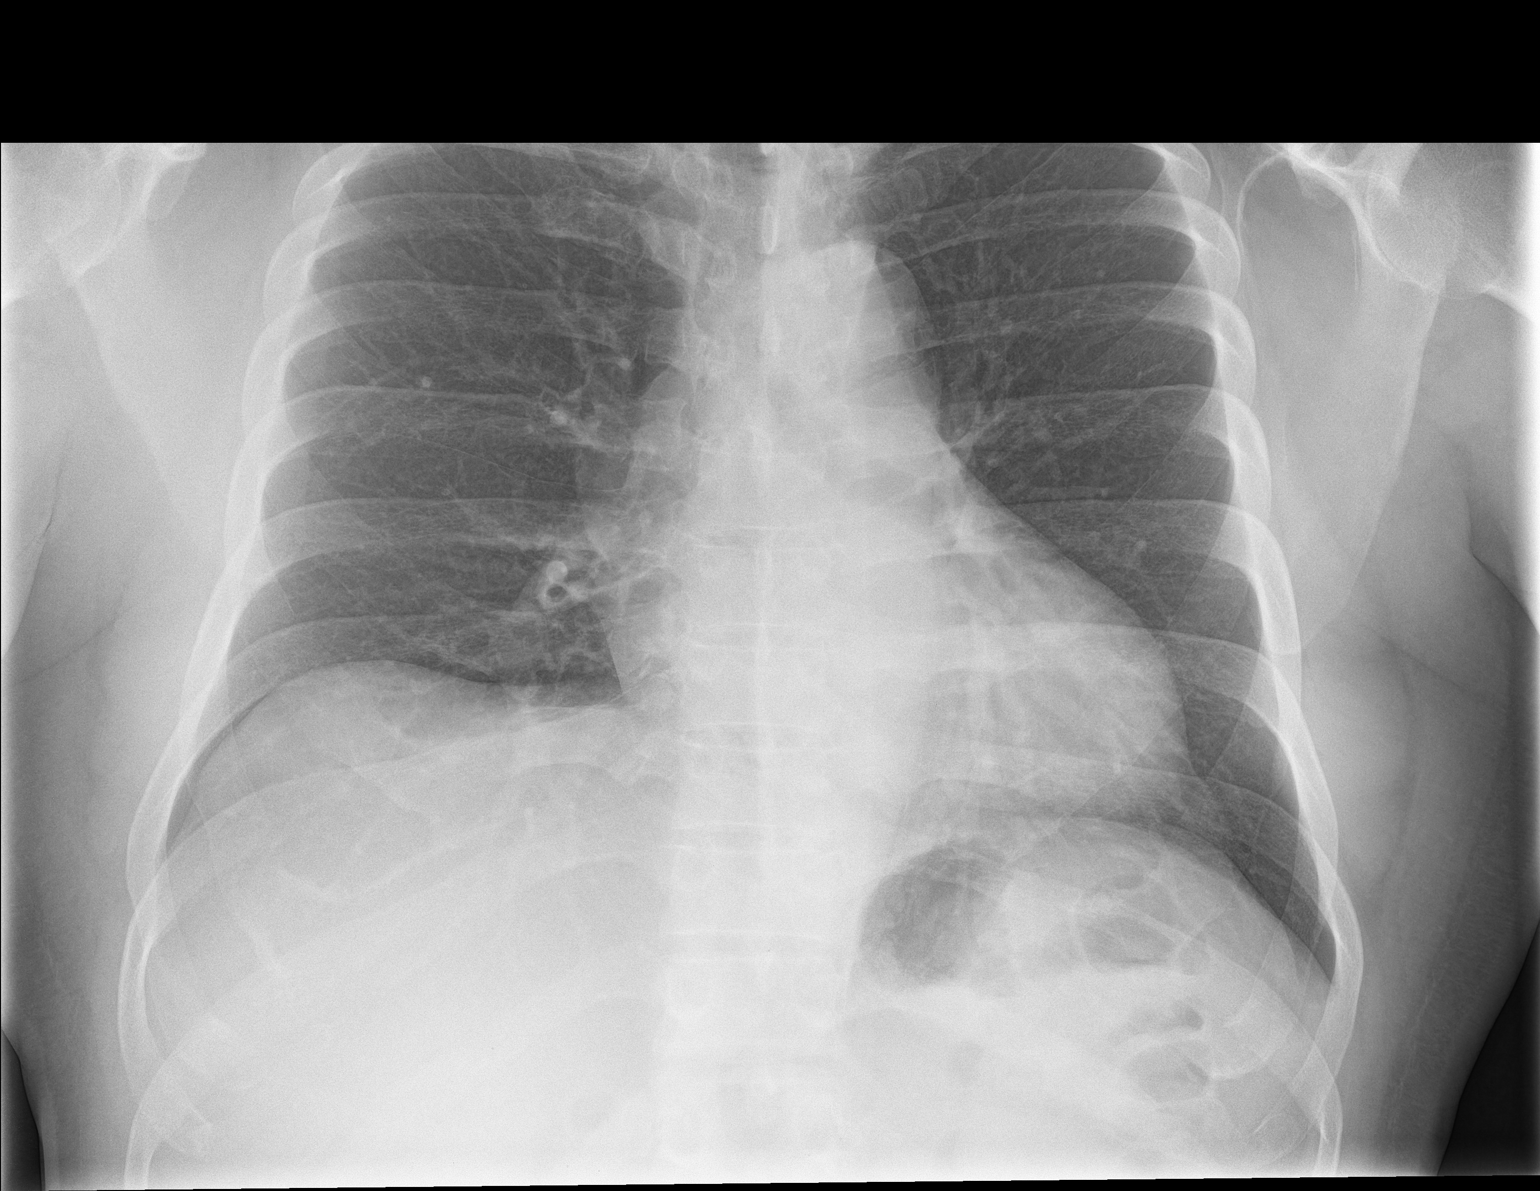

[chest lat]
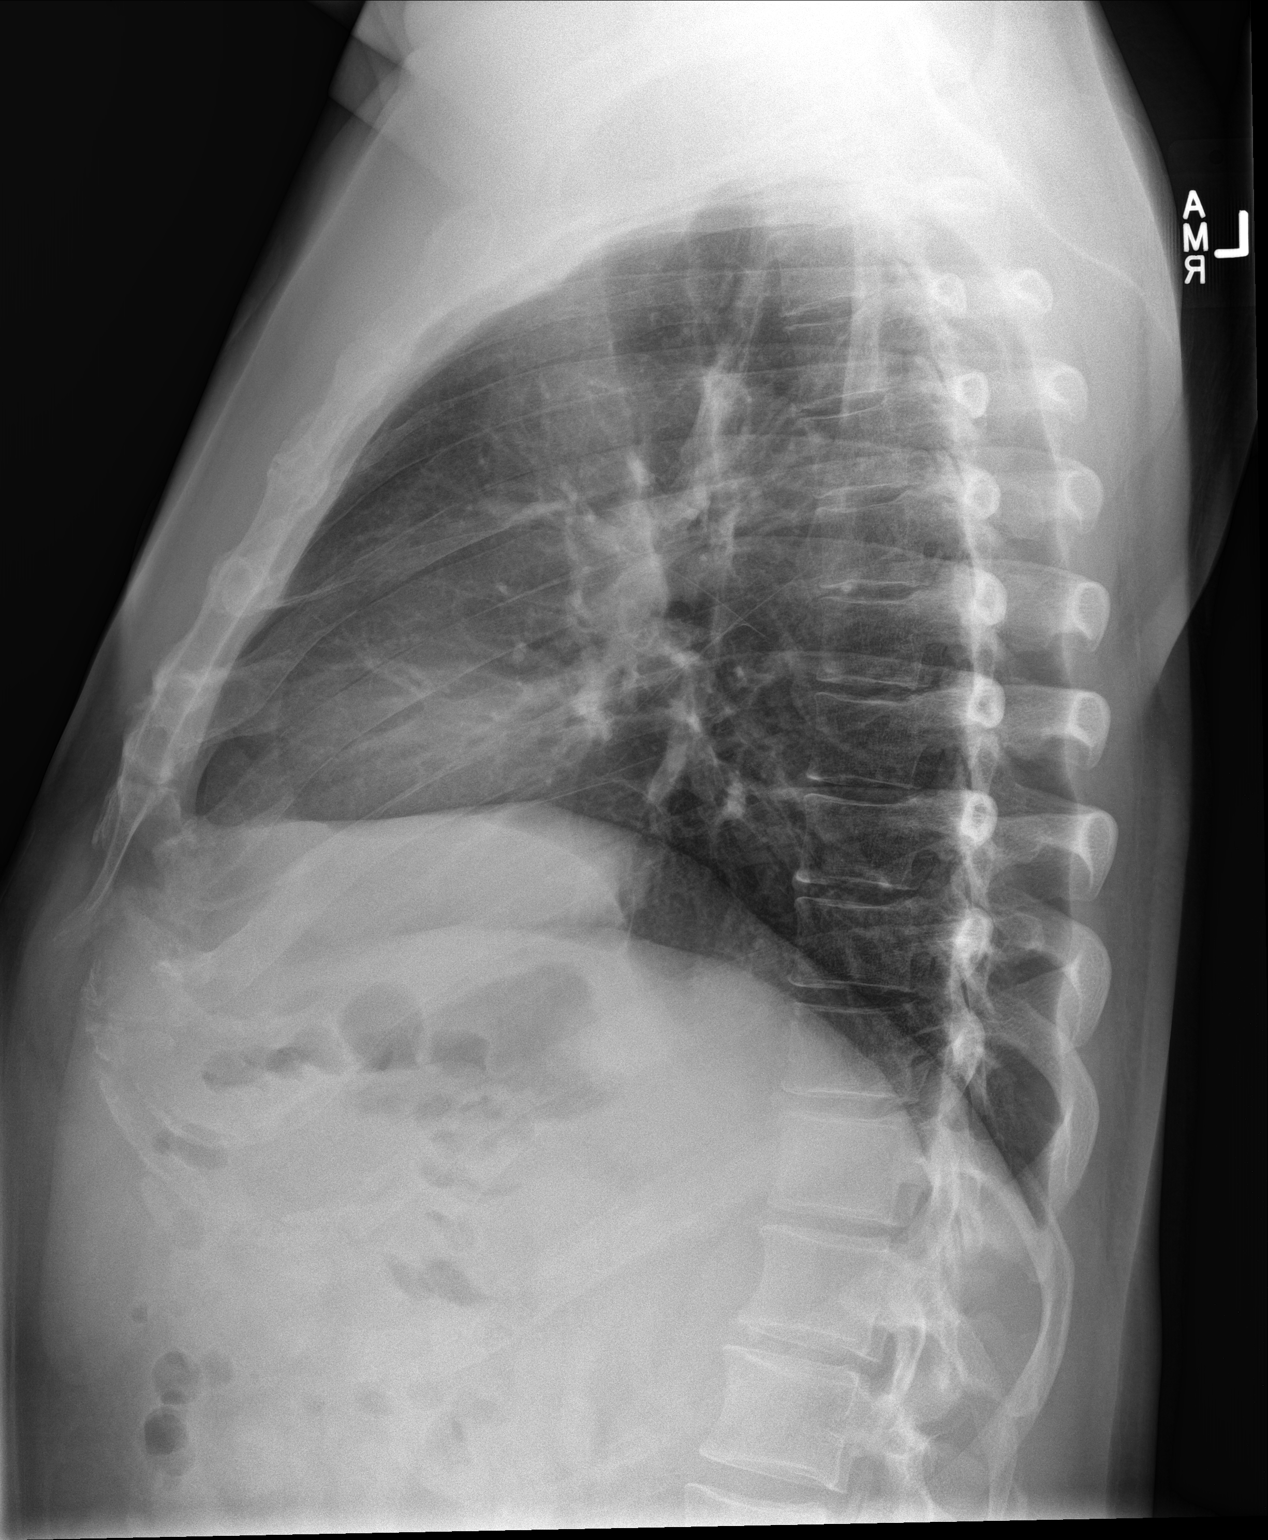

[2 of 2 positions shown; findings below may reference images not displayed]

FINDINGS: Heart size and mediastinal contours are within normal limits. Lungs
are clear. No pleural effusion or pneumothorax seen.

Elevation of the right hemidiaphragm, of uncertain acuity. Osseous
and soft tissue structures about the chest are otherwise
unremarkable.
IMPRESSION: No active cardiopulmonary disease. No evidence of pneumonia or
pulmonary edema.

## 2018-10-25 ENCOUNTER — Other Ambulatory Visit: Payer: Self-pay | Admitting: Family Medicine

## 2018-12-17 ENCOUNTER — Other Ambulatory Visit: Payer: Self-pay | Admitting: Family Medicine

## 2019-02-11 ENCOUNTER — Other Ambulatory Visit: Payer: Self-pay | Admitting: Family Medicine

## 2019-03-04 ENCOUNTER — Other Ambulatory Visit: Payer: Self-pay | Admitting: Family Medicine

## 2019-03-06 NOTE — Telephone Encounter (Signed)
Call and sched virt visit, then may ref times one 

## 2019-03-10 NOTE — Telephone Encounter (Signed)
Pt called back and scheduled a Med Check for Wednesday 03/12/19.

## 2019-03-10 NOTE — Telephone Encounter (Signed)
I called and left a vm to r/c. 

## 2019-03-12 ENCOUNTER — Ambulatory Visit (INDEPENDENT_AMBULATORY_CARE_PROVIDER_SITE_OTHER): Payer: BLUE CROSS/BLUE SHIELD | Admitting: Family Medicine

## 2019-03-12 ENCOUNTER — Encounter: Payer: Self-pay | Admitting: Family Medicine

## 2019-03-12 ENCOUNTER — Other Ambulatory Visit: Payer: Self-pay

## 2019-03-12 DIAGNOSIS — I10 Essential (primary) hypertension: Secondary | ICD-10-CM | POA: Diagnosis not present

## 2019-03-12 DIAGNOSIS — E119 Type 2 diabetes mellitus without complications: Secondary | ICD-10-CM

## 2019-03-12 DIAGNOSIS — E785 Hyperlipidemia, unspecified: Secondary | ICD-10-CM

## 2019-03-12 MED ORDER — GLIPIZIDE 5 MG PO TABS: ORAL_TABLET | ORAL | 1 refills | Status: DC

## 2019-03-12 MED ORDER — METFORMIN HCL 500 MG PO TABS: ORAL_TABLET | ORAL | 5 refills | Status: DC

## 2019-03-12 MED ORDER — CANAGLIFLOZIN 300 MG PO TABS: ORAL_TABLET | ORAL | 1 refills | Status: DC

## 2019-03-12 MED ORDER — ATORVASTATIN CALCIUM 10 MG PO TABS: 10.0000 mg | ORAL_TABLET | Freq: Every day | ORAL | 1 refills | Status: DC

## 2019-03-12 MED ORDER — LOSARTAN POTASSIUM 50 MG PO TABS: 50.0000 mg | ORAL_TABLET | Freq: Every day | ORAL | 1 refills | Status: DC

## 2019-03-12 MED ORDER — FENOFIBRATE 160 MG PO TABS: 160.0000 mg | ORAL_TABLET | Freq: Every day | ORAL | 1 refills | Status: DC

## 2019-03-12 NOTE — Progress Notes (Signed)
   Subjective:    Patient ID: Corey [name], male    DOB: Mar 04, 1966, 53 y.o.   MRN: [id number] Telephone visit Diabetes  He presents for his follow-up diabetic visit. He has type 2 diabetes mellitus. Current diabetic treatments: metformin, glipizide, invokana. He is compliant with treatment all of the time. He is following a generally healthy diet. He never participates in exercise. Home blood sugar record trend: between 80 -150. Eye exam is current (oct 2019).   Pt states no concerns today.   Virtual Visit via Telephone Note  I connected with Corey [name] on 03/12/19 at  1:10 PM EDT by telephone and verified that I am speaking with the correct person using two identifiers.  Location: Patient: home Provider: office   I discussed the limitations, risks, security and privacy concerns of performing an evaluation and management service by telephone and the availability of in person appointments. I also discussed with the patient that there may be a patient responsible charge related to this service. The patient expressed understanding and agreed to proceed.   History of Present Illness:    Observations/Objective:   Assessment and Plan:   Follow Up Instructions:    I discussed the assessment and treatment plan with the patient. The patient was provided an opportunity to ask questions and all were answered. The patient agreed with the plan and demonstrated an understanding of the instructions.   The patient was advised to call back or seek an in-person evaluation if the symptoms worsen or if the condition fails to improve as anticipated.  I provided 25 minutes of non-face-to-face time during this encounter.  Patient claims compliance with diabetes medication. No obvious side effects. Reports no substantial low sugar spells. Most numbers are generally in good range when checked fasting. Generally does not miss a dose of medication. Watching diabetic diet closely   Blood pressure medicine  and blood pressure levels reviewed today with patient. Compliant with blood pressure medicine. States does not miss a dose. No obvious side effects. Blood pressure generally good when checked elsewhere. Watching salt intake.   Patient continues to take lipid medication regularly. No obvious side effects from it. Generally does not miss a dose. Prior blood work results are reviewed with patient. Patient continues to work on fat intake in diet   Review of Systems No headache, no major weight loss or weight gain, no chest pain no back pain abdominal pain no change in bowel habits complete ROS otherwise negative     Objective:   Physical Exam   Virtual visit     Assessment & Plan:  Impression 1 type 2 diabetes.  Suboptimal control discussed patient to work harder on diet and exercise.  2.  Hypertension.  Prior good control patient compliant with medications  3.  Hyperlipidemia.  Prior blood work reviewed.  Diet reviewed.  Patient to hold off on blood work currently rationale discussed  Recheck in 4 months for physical plus blood work plus reevaluation.  Will need A1c at that time

## 2019-04-17 ENCOUNTER — Ambulatory Visit: Payer: BLUE CROSS/BLUE SHIELD | Admitting: Family Medicine

## 2019-06-12 ENCOUNTER — Other Ambulatory Visit: Payer: Self-pay | Admitting: Family Medicine

## 2019-06-25 ENCOUNTER — Other Ambulatory Visit: Payer: Self-pay | Admitting: *Deleted

## 2019-06-25 DIAGNOSIS — I712 Thoracic aortic aneurysm, without rupture, unspecified: Secondary | ICD-10-CM

## 2019-07-22 ENCOUNTER — Telehealth: Payer: Self-pay | Admitting: Family Medicine

## 2019-07-22 DIAGNOSIS — E119 Type 2 diabetes mellitus without complications: Secondary | ICD-10-CM

## 2019-07-22 DIAGNOSIS — Z125 Encounter for screening for malignant neoplasm of prostate: Secondary | ICD-10-CM

## 2019-07-22 DIAGNOSIS — Z79899 Other long term (current) drug therapy: Secondary | ICD-10-CM

## 2019-07-22 DIAGNOSIS — E785 Hyperlipidemia, unspecified: Secondary | ICD-10-CM

## 2019-07-22 DIAGNOSIS — I10 Essential (primary) hypertension: Secondary | ICD-10-CM

## 2019-07-22 NOTE — Telephone Encounter (Signed)
Patient has an appt next week for a diabetic check and said he usually goes to [facility] for bloodwork prior to his appts.  Needs and order for labs.

## 2019-07-22 NOTE — Telephone Encounter (Signed)
Labs 06/11/18: Lipid, liver, HgbA1c

## 2019-07-22 NOTE — Telephone Encounter (Signed)
Orders put in. Left message to return call  

## 2019-07-22 NOTE — Telephone Encounter (Signed)
Lip liv m7 psa urine pro A1c

## 2019-07-23 NOTE — Telephone Encounter (Signed)
Pt.notified

## 2019-07-24 DIAGNOSIS — Z79899 Other long term (current) drug therapy: Secondary | ICD-10-CM | POA: Diagnosis not present

## 2019-07-24 DIAGNOSIS — Z125 Encounter for screening for malignant neoplasm of prostate: Secondary | ICD-10-CM | POA: Diagnosis not present

## 2019-07-24 DIAGNOSIS — E119 Type 2 diabetes mellitus without complications: Secondary | ICD-10-CM | POA: Diagnosis not present

## 2019-07-24 DIAGNOSIS — I10 Essential (primary) hypertension: Secondary | ICD-10-CM | POA: Diagnosis not present

## 2019-07-24 DIAGNOSIS — E785 Hyperlipidemia, unspecified: Secondary | ICD-10-CM | POA: Diagnosis not present

## 2019-07-25 ENCOUNTER — Encounter: Payer: BLUE CROSS/BLUE SHIELD | Admitting: Cardiothoracic Surgery

## 2019-07-25 ENCOUNTER — Encounter: Payer: Self-pay | Admitting: Cardiothoracic Surgery

## 2019-07-25 LAB — BASIC METABOLIC PANEL
BUN/Creatinine Ratio: 14 (ref 9–20)
BUN: 13 mg/dL (ref 6–24)
CO2: 21 mmol/L (ref 20–29)
Calcium: 10.5 mg/dL — ABNORMAL HIGH (ref 8.7–10.2)
Chloride: 105 mmol/L (ref 96–106)
Creatinine, Ser: 0.94 mg/dL (ref 0.76–1.27)
GFR calc Af Amer: 105 mL/min/{1.73_m2} (ref >59)
GFR calc non Af Amer: 91 mL/min/{1.73_m2} (ref >59)
Glucose: 145 mg/dL — ABNORMAL HIGH (ref 65–99)
Potassium: 4.8 mmol/L (ref 3.5–5.2)
Sodium: 143 mmol/L (ref 134–144)

## 2019-07-25 LAB — LIPID PANEL
Chol/HDL Ratio: 5.7 ratio — ABNORMAL HIGH (ref 0.0–5.0)
Cholesterol, Total: 153 mg/dL (ref 100–199)
HDL: 27 mg/dL — ABNORMAL LOW (ref >39)
LDL Chol Calc (NIH): 89 mg/dL (ref 0–99)
Triglycerides: 214 mg/dL — ABNORMAL HIGH (ref 0–149)
VLDL Cholesterol Cal: 37 mg/dL (ref 5–40)

## 2019-07-25 LAB — HEPATIC FUNCTION PANEL
ALT: 38 IU/L (ref 0–44)
AST: 33 IU/L (ref 0–40)
Albumin: 5.1 g/dL — ABNORMAL HIGH (ref 3.8–4.9)
Alkaline Phosphatase: 33 IU/L — ABNORMAL LOW (ref 39–117)
Bilirubin Total: 0.4 mg/dL (ref 0.0–1.2)
Bilirubin, Direct: 0.18 mg/dL (ref 0.00–0.40)
Total Protein: 8.1 g/dL (ref 6.0–8.5)

## 2019-07-25 LAB — PSA: Prostate Specific Ag, Serum: 0.6 ng/mL (ref 0.0–4.0)

## 2019-07-25 LAB — HEMOGLOBIN A1C
Est. average glucose Bld gHb Est-mCnc: 200 mg/dL
Hgb A1c MFr Bld: 8.6 % — ABNORMAL HIGH (ref 4.8–5.6)

## 2019-07-25 LAB — MICROALBUMIN / CREATININE URINE RATIO
Creatinine, Urine: 65 mg/dL
Microalb/Creat Ratio: 44 mg/g creat — ABNORMAL HIGH (ref 0–29)
Microalbumin, Urine: 28.3 ug/mL

## 2019-07-31 ENCOUNTER — Ambulatory Visit: Payer: BLUE CROSS/BLUE SHIELD | Admitting: Family Medicine

## 2019-07-31 ENCOUNTER — Encounter: Payer: Self-pay | Admitting: Family Medicine

## 2019-07-31 ENCOUNTER — Other Ambulatory Visit: Payer: Self-pay

## 2019-07-31 VITALS — BP 158/98 | Temp 96.4°F | Wt 201.4 lb

## 2019-07-31 DIAGNOSIS — Z23 Encounter for immunization: Secondary | ICD-10-CM

## 2019-07-31 DIAGNOSIS — E119 Type 2 diabetes mellitus without complications: Secondary | ICD-10-CM

## 2019-07-31 DIAGNOSIS — E785 Hyperlipidemia, unspecified: Secondary | ICD-10-CM | POA: Diagnosis not present

## 2019-07-31 DIAGNOSIS — I10 Essential (primary) hypertension: Secondary | ICD-10-CM | POA: Diagnosis not present

## 2019-07-31 MED ORDER — GLIPIZIDE 5 MG PO TABS: ORAL_TABLET | ORAL | 1 refills | Status: DC

## 2019-07-31 MED ORDER — ATORVASTATIN CALCIUM 20 MG PO TABS: ORAL_TABLET | ORAL | 1 refills | Status: DC

## 2019-07-31 MED ORDER — LOSARTAN POTASSIUM 100 MG PO TABS: ORAL_TABLET | ORAL | 1 refills | Status: DC

## 2019-07-31 MED ORDER — CANAGLIFLOZIN 300 MG PO TABS: ORAL_TABLET | ORAL | 1 refills | Status: DC

## 2019-07-31 MED ORDER — METFORMIN HCL 500 MG PO TABS: ORAL_TABLET | ORAL | 1 refills | Status: DC

## 2019-07-31 MED ORDER — FENOFIBRATE 160 MG PO TABS: 160.0000 mg | ORAL_TABLET | Freq: Every day | ORAL | 1 refills | Status: DC

## 2019-07-31 MED ORDER — CONTOUR NEXT TEST VI STRP: ORAL_STRIP | 5 refills | Status: AC

## 2019-07-31 NOTE — Progress Notes (Signed)
Subjective:  Patient arrives with numerous concerns  Patient ID: Corey [name], male    DOB: 1966-04-01, 53 y.o.   MRN: [id number]  Diabetes He presents for his follow-up diabetic visit. He has type 2 diabetes mellitus. There are no hypoglycemic associated symptoms. There are no diabetic associated symptoms. There are no hypoglycemic complications. There are no diabetic complications. He is following a generally healthy diet. Exercise: does a lot of walking with job. He does not see a podiatrist.Eye exam is current.   Results for orders placed or performed in visit on 07/22/19  Lipid panel  Result Value Ref Range   Cholesterol, Total 153 100 - 199 mg/dL   Triglycerides 702 (H) 0 - 149 mg/dL   HDL 27 (L) >63 mg/dL   VLDL Cholesterol Cal 37 5 - 40 mg/dL   LDL Chol Calc (NIH) 89 0 - 99 mg/dL   Chol/HDL Ratio 5.7 (H) 0.0 - 5.0 ratio  Hepatic function panel  Result Value Ref Range   Total Protein 8.1 6.0 - 8.5 g/dL   Albumin 5.1 (H) 3.8 - 4.9 g/dL   Bilirubin Total 0.4 0.0 - 1.2 mg/dL   Bilirubin, Direct 7.85 0.00 - 0.40 mg/dL   Alkaline Phosphatase 33 (L) 39 - 117 IU/L   AST 33 0 - 40 IU/L   ALT 38 0 - 44 IU/L  Basic metabolic panel  Result Value Ref Range   Glucose 145 (H) 65 - 99 mg/dL   BUN 13 6 - 24 mg/dL   Creatinine, Ser 8.85 0.76 - 1.27 mg/dL   GFR calc non Af Amer 91 >59 mL/min/1.73   GFR calc Af Amer 105 >59 mL/min/1.73   BUN/Creatinine Ratio 14 9 - 20   Sodium 143 134 - 144 mmol/L   Potassium 4.8 3.5 - 5.2 mmol/L   Chloride 105 96 - 106 mmol/L   CO2 21 20 - 29 mmol/L   Calcium 10.5 (H) 8.7 - 10.2 mg/dL  PSA  Result Value Ref Range   Prostate Specific Ag, Serum 0.6 0.0 - 4.0 ng/mL  Microalbumin / creatinine urine ratio  Result Value Ref Range   Creatinine, Urine 65.0 Not Estab. mg/dL   Microalbumin, Urine 02.7 Not Estab. ug/mL   Microalb/Creat Ratio 44 (H) 0 - 29 mg/g creat  Hemoglobin A1c  Result Value Ref Range   Hgb A1c MFr Bld 8.6 (H) 4.8 - 5.6 %   Est.  average glucose Bld gHb Est-mCnc 200 mg/dL   Blood pressure medicine and blood pressure levels reviewed today with patient. Compliant with blood pressure medicine. States does not miss a dose. No obvious side effects. Blood pressure generally good when checked elsewhere. Watching salt intake.   Patient continues to take lipid medication regularly. No obvious side effects from it. Generally does not miss a dose. Prior blood work results are reviewed with patient. Patient continues to work on fat intake in diet  Patient claims compliance with diabetes medication. No obvious side effects. Reports no substantial low sugar spells. Most numbers are generally in good range when checked fasting. Generally does not miss a dose of medication. Watching diabetic diet closely     Review of Systems No headache, no major weight loss or weight gain, no chest pain no back pain abdominal pain no change in bowel habits complete ROS otherwise negative     Objective:   Physical Exam  Alert and oriented, vitals reviewed and stable, NAD ENT-TM's and ext canals WNL bilat via otoscopic exam Soft palate,  tonsils and post pharynx WNL via oropharyngeal exam Neck-symmetric, no masses; thyroid nonpalpable and nontender Pulmonary-no tachypnea or accessory muscle use; Clear without wheezes via auscultation Card--no abnrml murmurs, rhythm reg and rate WNL Carotid pulses symmetric, without bruits  Feet pulses intact and symmetric sensation intact      Assessment & Plan:  Impression type 2 diabetes.  Poor control.  Discussed.  Patient prefers no additional medication.  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  He admits to poor dietary compliance he will work hard on this  2.  Hyperlipidemia.  Suboptimal control discussed medications adjusted Lipitor tablet  3.  Hypertension.  Suboptimal control also.  Need to control better rationale discussed increased to  100 mg daily on the valsartan rationale discussed  4.  History of non-ST elevated myocardial infarction.  Coronary arteries overall still decent.  Importance of tight control risk factors reemphasized  Follow-up as scheduled diet exercise discussed  Greater than 50% of this 25 minute face to face visit was spent in counseling and discussion and coordination of care regarding the above diagnosis/diagnosies

## 2019-08-21 ENCOUNTER — Telehealth: Payer: Self-pay | Admitting: Vascular Surgery

## 2019-08-21 NOTE — Telephone Encounter (Signed)
Called pt 3x to schedule CT. Will send letter. °

## 2019-08-26 ENCOUNTER — Encounter: Payer: Self-pay | Admitting: Vascular Surgery

## 2019-09-11 ENCOUNTER — Other Ambulatory Visit: Payer: Self-pay | Admitting: *Deleted

## 2019-09-11 ENCOUNTER — Encounter: Payer: BLUE CROSS/BLUE SHIELD | Admitting: Cardiothoracic Surgery

## 2019-09-12 ENCOUNTER — Encounter: Payer: BLUE CROSS/BLUE SHIELD | Admitting: Cardiothoracic Surgery

## 2019-09-12 ENCOUNTER — Other Ambulatory Visit: Payer: BLUE CROSS/BLUE SHIELD

## 2019-09-16 ENCOUNTER — Institutional Professional Consult (permissible substitution) (INDEPENDENT_AMBULATORY_CARE_PROVIDER_SITE_OTHER): Payer: BLUE CROSS/BLUE SHIELD | Admitting: Cardiothoracic Surgery

## 2019-09-16 ENCOUNTER — Ambulatory Visit
Admission: RE | Admit: 2019-09-16 | Discharge: 2019-09-16 | Disposition: A | Discharge status: Home / Self Care | Payer: BLUE CROSS/BLUE SHIELD | Source: Ambulatory Visit | Attending: Cardiothoracic Surgery | Admitting: Cardiothoracic Surgery

## 2019-09-16 ENCOUNTER — Other Ambulatory Visit: Payer: Self-pay

## 2019-09-16 VITALS — BP 170/84 | HR 98 | Temp 97.5°F | Resp 20 | Ht 67.0 in | Wt 204.0 lb

## 2019-09-16 DIAGNOSIS — I712 Thoracic aortic aneurysm, without rupture, unspecified: Secondary | ICD-10-CM

## 2019-09-16 MED ORDER — IOPAMIDOL (ISOVUE-370) INJECTION 76%
75.0000 mL | Freq: Once | INTRAVENOUS | Status: AC | PRN
  Administered 2019-09-16: 75 mL via INTRAVENOUS

## 2019-09-24 NOTE — Progress Notes (Signed)
[address]       [place] [address]             [phone]     CARDIOTHORACIC SURGERY CONSULTATION REPORT  Referring Provider is Early, Arvilla Meres, MD Primary Cardiologist is No primary care provider on file. PCP is Luking, Grace Bushy, MD  Chief Complaint  Patient presents with  . Coronary Artery Disease    Consult    HPI:  53 yo man with known asc aortic aneurysm presents for evaluation. This was discovered incidentally when he presented to the ED over 2 years ago for chest pain. Work-up included chest ct which revealed asc aneurysm of approximately 4 cm in diameter. He denies any other episodes of chest pain. He has been prescribed antihypertensive meds but is not taking metoprolol (does endorse taking losartan). He has diagnosis of DM but does not check blood sugars at home (most recent A1c 8.6 and increasing over past 2 years). 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 He denies FHx of TAAA He quit smoking several years ago.  Past Medical History:  Diagnosis Date  . Essential hypertension   . Hypertriglyceridemia   . Type 2 diabetes mellitus ([place])     Past Surgical History:  Procedure Laterality Date  . COLONOSCOPY N/A 10/14/2014   Procedure: COLONOSCOPY;  Surgeon: Daneil Dolin, MD;  Location: [facility];  Service: Endoscopy;  Laterality: N/A;  8:30 AM  . LEFT HEART CATH AND CORONARY ANGIOGRAPHY N/A 07/13/2017   Procedure: LEFT HEART CATH AND CORONARY ANGIOGRAPHY;  Surgeon: Burnell Blanks, MD;  Location: [facility];  Service: Cardiovascular;  Laterality: N/A;  . NO PAST SURGERIES      Family History  Problem Relation Age of Onset  . Colon cancer Other   . Cancer Father   . Hypertension Father   . Diabetes Father   . Heart attack Father   . Colon cancer Maternal Uncle     Social History   Socioeconomic History  . Marital status: Married    Spouse name: Not on file  . Number of children: Not on  file  . Years of education: Not on file  . Highest education level: Not on file  Occupational History  . Not on file  Social Needs  . Financial resource strain: Not on file  . Food insecurity    Worry: Not on file    Inability: Not on file  . Transportation needs    Medical: Not on file    Non-medical: Not on file  Tobacco Use  . Smoking status: Former Smoker    Packs/day: 0.50    Years: 10.00    Pack years: 5.00    Types: Cigarettes    Quit date: 02/07/1993    Years since quitting: 26.6  . Smokeless tobacco: Never Used  Substance and Sexual Activity  . Alcohol use: Yes    Comment: occasionally  . Drug use: No  . Sexual activity: Yes  Lifestyle  . Physical activity    Days per week: Not on file    Minutes per session: Not on file  . Stress: Not on file  Relationships  . Social Herbalist on phone: Not on file    Gets together: Not on file    Attends religious service: Not on file    Active member of club or organization: Not on file    Attends meetings of  clubs or organizations: Not on file    Relationship status: Not on file  . Intimate partner violence    Fear of current or ex partner: Not on file    Emotionally abused: Not on file    Physically abused: Not on file    Forced sexual activity: Not on file  Other Topics Concern  . Not on file  Social History Narrative  . Not on file    Current Outpatient Medications  Medication Sig Dispense Refill  . aspirin 81 MG EC tablet Take 1 tablet (81 mg total) by mouth daily.    Marland Kitchen atorvastatin (LIPITOR) 20 MG tablet Take one tablet by mouth daily 90 tablet 1  . blood glucose meter kit and supplies KIT Dispense based on patient and insurance preference. Test blood sugar once daily. Type 2 diabetes. E11.9 1 each 0  . canagliflozin (INVOKANA) 300 MG TABS tablet TAKE 1 TABLET BY MOUTH ONCE DAILY BEFORE BREAKFAST 90 tablet 1  . fenofibrate 160 MG tablet Take 1 tablet (160 mg total) by mouth daily. 90 tablet 1  .  glipiZIDE (GLUCOTROL) 5 MG tablet TAKE 1 TABLET BY MOUTH IN THE MORNING AND 2 IN THE EVENING 270 tablet 1  . glucose blood (CONTOUR NEXT TEST) test strip USE 1 STRIP TO CHECK GLUCOSE ONCE DAILY 50 each 5  . losartan (COZAAR) 100 MG tablet Take one tablet by mouth daily 90 tablet 1  . metFORMIN (GLUCOPHAGE) 500 MG tablet TAKE 2 TABLETS BY MOUTH ONCE DAILY IN THE MORNING, 1 TABLET AT NOON, AND  2 TABLETS  IN THE EVENING 450 tablet 1  . metoprolol tartrate (LOPRESSOR) 25 MG tablet Take 1 tablet (25 mg total) by mouth 2 (two) times daily. (Patient not taking: Reported on 09/16/2019) 60 tablet 11   No current facility-administered medications for this visit.     No Known Allergies    Review of Systems:   General:  good appetite, - weight gain/ weight loss  Cardiac:  denies chest pain with exertion/ at rest or exertion; denies PND/orthopnea, no palpitations/ arrhythmia/atrial fibrillation,    Respiratory:  no shortness of breath, denies asthma,  GI:   no difficulty swallowing, no reflux, no frequent heartburn, last c-scope 01/09/14  GU:   negative urinary tract infection, negative enlarged prostate, negative kidney disease  Vascular:  no pain suggestive of claudication,   Neuro:   denies stroke/TIA's/seizures/ headaches,   Musculoskeletal: negative  Skin:   negative  Psych:   negative  Eyes:   negative  ENT:   no dentures, last saw dentist 01/05/2016  Hematologic:  negative  Endocrine:  + diabetes, does not check CBG's at home routinely     Physical Exam:   BP (!) 170/84 (BP Location: Right Arm)   Pulse 98   Temp (!) 97.5 F (36.4 C) (Skin)   Resp 20   Ht 5' 7" (1.702 m)   Wt 92.5 kg   SpO2 94% Comment: RA  BMI 31.95 kg/m   General:   well-appearing; no distress   HEENT:  Unremarkable   Neck:   no JVD, no bruits, no adenopathy  Chest:   clear to auscultation, symmetrical breath sounds, no wheezes, no rhonchi  CV:   RRR, no murmur   Abdomen:  soft, non-tender, no masses    Extremities:  warm, well-perfused, pulses intact throughout, no LE edema  Rectal/GU  Deferred  Neuro:   Grossly non-focal and symmetrical throughout  Skin:   Clean and dry, no rashes,  no breakdown   Diagnostic Tests:  I have reviewed his most recent CT which is essentially unchanged from previous exam although there is significant motion artifact which makes the images relatively poor quality.    Impression:  53 yo man with ascending aortic aneurysm that does not meet current criteria for elective repair. However, he has concerning risk factors including poorly managed DM and HTN. I have emphasized the importance of better management of these 2 issues for his overall health in the future. He and his wife verbalize understanding of implications of poorly controlled DM and HTN.   Plan:  Repeat CT scan in one year; control BP and DM Report immediately to the local ED if severe back pain or chest pain which may indicate an impending aortic catastrophe.   I spent in excess of 30 minutes during the conduct of this office consultation and >50% of this time involved direct face-to-face encounter with the patient for counseling and/or coordination of their care.          Level 3 Office Consult = 40 minutes         Level 4 Office Consult = 60 minutes         Level 5 Office Consult = 80 minutes  B. Murvin Natal, MD 09/24/2019 9:26 AM

## 2019-10-07 ENCOUNTER — Ambulatory Visit: Payer: BLUE CROSS/BLUE SHIELD | Attending: Family Medicine

## 2019-10-07 ENCOUNTER — Other Ambulatory Visit: Payer: Self-pay

## 2019-10-07 ENCOUNTER — Other Ambulatory Visit: Payer: BLUE CROSS/BLUE SHIELD

## 2019-10-07 DIAGNOSIS — Z20822 Contact with and (suspected) exposure to covid-19: Secondary | ICD-10-CM

## 2019-10-07 DIAGNOSIS — Z20828 Contact with and (suspected) exposure to other viral communicable diseases: Secondary | ICD-10-CM | POA: Diagnosis not present

## 2019-10-08 ENCOUNTER — Telehealth: Payer: Self-pay

## 2019-10-08 DIAGNOSIS — R05 Cough: Secondary | ICD-10-CM | POA: Diagnosis not present

## 2019-10-08 DIAGNOSIS — R509 Fever, unspecified: Secondary | ICD-10-CM | POA: Diagnosis not present

## 2019-10-08 LAB — NOVEL CORONAVIRUS, NAA: SARS-CoV-2, NAA: DETECTED — AB

## 2019-10-08 NOTE — Telephone Encounter (Signed)
Pt notified of positive COVID-19 test results. Pt verbalized understanding. Pt reports that he doesn't have any symptoms.Pt advised to remain in self quarantine until at least 10 days since symptom onset And 3 consecutive days fever free without antipyretics And improvement in respiratory symptoms. Patient advised to utilize over the counter medications to treat symptoms. Pt advised to seek treatment in the ED if respiratory issues/distress develops.Pt advised they should only leave home to seek and medical care and must wear a mask in public. Pt instructed to limit contact with family members or caregivers in the home. Pt advised to practice social distancing and to continue to use good preventative care measures such has frequent hand washing, staying out of crowds and cleaning hard surfaces frequently touched in the home.Pt informed that the health department will likely follow up and may have additional recommendations. Will notify [facility] Department.  Patient states that when he check result this morning it said negative and now it says positive.

## 2019-10-09 ENCOUNTER — Other Ambulatory Visit: Payer: Self-pay | Admitting: Adult Health

## 2019-10-09 ENCOUNTER — Telehealth: Payer: Self-pay | Admitting: Nurse Practitioner

## 2019-10-09 DIAGNOSIS — E1169 Type 2 diabetes mellitus with other specified complication: Secondary | ICD-10-CM

## 2019-10-09 DIAGNOSIS — U071 COVID-19: Secondary | ICD-10-CM

## 2019-10-09 NOTE — Progress Notes (Signed)
  I connected by phone with Corey [name] on 10/09/2019 at 12:15 PM to discuss the potential use of an new treatment for mild to moderate COVID-19 viral infection in non-hospitalized patients.  This patient is a 53 y.o. male that meets the FDA criteria for Emergency Use Authorization of bamlanivimab or casirivimab\imdevimab.  Has a (+) direct SARS-CoV-2 viral test result  Has mild or moderate COVID-19   Is ? 55 years of age and weighs ? 40 kg  Is NOT hospitalized due to COVID-19  Is NOT requiring oxygen therapy or requiring an increase in baseline oxygen flow rate due to COVID-19  Is within 10 days of symptom onset  Has at least one of the high risk factor(s) for progression to severe COVID-19 and/or hospitalization as defined in EUA.  Specific high risk criteria : Diabetes   I have spoken and communicated the following to the patient or parent/caregiver:  1. FDA has authorized the emergency use of bamlanivimab and casirivimab\imdevimab for the treatment of mild to moderate COVID-19 in adults and pediatric patients with positive results of direct SARS-CoV-2 viral testing who are 47 years of age and older weighing at least 40 kg, and who are at high risk for progressing to severe COVID-19 and/or hospitalization.  2. The significant known and potential risks and benefits of bamlanivimab and casirivimab\imdevimab, and the extent to which such potential risks and benefits are unknown.  3. Information on available alternative treatments and the risks and benefits of those alternatives, including clinical trials.  4. Patients treated with bamlanivimab and casirivimab\imdevimab should continue to self-isolate and use infection control measures (e.g., wear mask, isolate, social distance, avoid sharing personal items, clean and disinfect "high touch" surfaces, and frequent handwashing) according to CDC guidelines.   5. The patient or parent/caregiver has the option to accept or refuse bamlanivimab  or casirivimab\imdevimab .  After reviewing this information with the patient, The patient agreed to proceed with receiving the bamlanimivab infusion and will be provided a copy of the Fact sheet prior to receiving the infusion.Lynelle Smoke Haizley Cannella 10/09/2019 12:15 PM

## 2019-10-09 NOTE — Telephone Encounter (Signed)
Called to Discuss with patient about Covid symptoms and the use of bamlanivimab, a monoclonal antibody infusion for those with mild to moderate Covid symptoms and at a high risk of hospitalization.     Pt is qualified for this infusion at the [facility] infusion center due to co-morbid conditions and/or a member of an at-risk group.    Patient Active Problem List   Diagnosis Date Noted  . Elevated troponin 07/13/2017  . NSTEMI (non-ST elevated myocardial infarction) ([place]) 07/12/2017  . Diabetes ([place]) 02/09/2013  . Other and unspecified hyperlipidemia 02/09/2013  . Essential hypertension, benign 02/09/2013      Unable to reach pt

## 2019-10-10 ENCOUNTER — Ambulatory Visit (HOSPITAL_COMMUNITY)
Admission: RE | Admit: 2019-10-10 | Discharge: 2019-10-10 | Disposition: A | Discharge status: Home / Self Care | Payer: BLUE CROSS/BLUE SHIELD | Source: Ambulatory Visit | Attending: Pulmonary Disease | Admitting: Pulmonary Disease

## 2019-10-10 ENCOUNTER — Ambulatory Visit (HOSPITAL_COMMUNITY): Payer: BLUE CROSS/BLUE SHIELD

## 2019-10-10 DIAGNOSIS — E1169 Type 2 diabetes mellitus with other specified complication: Secondary | ICD-10-CM | POA: Insufficient documentation

## 2019-10-10 DIAGNOSIS — U071 COVID-19: Secondary | ICD-10-CM

## 2019-10-10 MED ORDER — FAMOTIDINE IN NACL 20-0.9 MG/50ML-% IV SOLN: 20.0000 mg | Freq: Once | INTRAVENOUS | Status: DC | PRN

## 2019-10-10 MED ORDER — SODIUM CHLORIDE 0.9 % IV SOLN
700.0000 mg | Freq: Once | INTRAVENOUS | Status: AC
  Administered 2019-10-10: 09:00:00 700 mg via INTRAVENOUS
  Filled 2019-10-10: qty 20

## 2019-10-10 MED ORDER — EPINEPHRINE 0.3 MG/0.3ML IJ SOAJ: 0.3000 mg | Freq: Once | INTRAMUSCULAR | Status: DC | PRN

## 2019-10-10 MED ORDER — SODIUM CHLORIDE 0.9 % IV SOLN
INTRAVENOUS | Status: DC | PRN
  Administered 2019-10-10: 250 mL via INTRAVENOUS

## 2019-10-10 MED ORDER — METHYLPREDNISOLONE SODIUM SUCC 125 MG IJ SOLR: 125.0000 mg | Freq: Once | INTRAMUSCULAR | Status: DC | PRN

## 2019-10-10 MED ORDER — DIPHENHYDRAMINE HCL 50 MG/ML IJ SOLN: 50.0000 mg | Freq: Once | INTRAMUSCULAR | Status: DC | PRN

## 2019-10-10 MED ORDER — ALBUTEROL SULFATE HFA 108 (90 BASE) MCG/ACT IN AERS: 2.0000 | INHALATION_SPRAY | Freq: Once | RESPIRATORY_TRACT | Status: DC | PRN

## 2019-10-10 NOTE — Progress Notes (Signed)
  Diagnosis: COVID-19  Physician: Dr. Wolfgang Phoenix   Procedure: [facility] Infusion [facility] Med: bamlanivimab infusion - Provided patient with bamlanimivab fact sheet for patients, parents and caregivers prior to infusion.  Complications: No immediate complications noted.  Discharge: Discharged home   [place] E 10/10/2019

## 2019-10-21 ENCOUNTER — Other Ambulatory Visit: Payer: Self-pay

## 2019-10-21 ENCOUNTER — Ambulatory Visit: Payer: BLUE CROSS/BLUE SHIELD | Attending: Internal Medicine

## 2019-10-21 DIAGNOSIS — Z20822 Contact with and (suspected) exposure to covid-19: Secondary | ICD-10-CM

## 2019-10-21 DIAGNOSIS — Z20828 Contact with and (suspected) exposure to other viral communicable diseases: Secondary | ICD-10-CM | POA: Diagnosis not present

## 2019-10-23 LAB — NOVEL CORONAVIRUS, NAA: SARS-CoV-2, NAA: NOT DETECTED

## 2019-10-27 ENCOUNTER — Other Ambulatory Visit: Payer: BLUE CROSS/BLUE SHIELD

## 2019-11-26 ENCOUNTER — Encounter: Payer: Self-pay | Admitting: Family Medicine

## 2020-03-17 ENCOUNTER — Telehealth: Payer: Self-pay | Admitting: *Deleted

## 2020-03-17 DIAGNOSIS — Z125 Encounter for screening for malignant neoplasm of prostate: Secondary | ICD-10-CM

## 2020-03-17 DIAGNOSIS — E785 Hyperlipidemia, unspecified: Secondary | ICD-10-CM

## 2020-03-17 DIAGNOSIS — E119 Type 2 diabetes mellitus without complications: Secondary | ICD-10-CM

## 2020-03-17 DIAGNOSIS — I10 Essential (primary) hypertension: Secondary | ICD-10-CM

## 2020-03-17 NOTE — Telephone Encounter (Signed)
Pls order, lipid panel, cbc, cmp, urine microalbumin, and a1c. Thx.  Dr. Ladona Ridgel

## 2020-03-17 NOTE — Telephone Encounter (Signed)
Patient has appt 05/13/20 for diabetic check up  Last labs 07/24/2019: Lipid, Liver, Met 7, HgbA1c, Urine microprotien, PSA

## 2020-03-17 NOTE — Telephone Encounter (Signed)
Blood work ordered in Epic. Patient notified. 

## 2020-03-19 ENCOUNTER — Other Ambulatory Visit: Payer: Self-pay | Admitting: Family Medicine

## 2020-03-19 NOTE — Telephone Encounter (Signed)
Last seen 07/31/19 for med check up

## 2020-04-08 ENCOUNTER — Ambulatory Visit: Payer: BC Managed Care – PPO | Admitting: Family Medicine

## 2020-04-08 ENCOUNTER — Encounter: Payer: Self-pay | Admitting: Family Medicine

## 2020-04-08 ENCOUNTER — Other Ambulatory Visit: Payer: Self-pay

## 2020-04-08 VITALS — BP 154/88 | HR 99 | Temp 97.7°F | Wt 201.4 lb

## 2020-04-08 DIAGNOSIS — M7542 Impingement syndrome of left shoulder: Secondary | ICD-10-CM | POA: Diagnosis not present

## 2020-04-08 MED ORDER — IBUPROFEN 800 MG PO TABS: 800.0000 mg | ORAL_TABLET | Freq: Three times a day (TID) | ORAL | 0 refills | Status: DC | PRN

## 2020-04-08 NOTE — Progress Notes (Signed)
Patient ID: Corey [name], male    DOB: 08-23-1966, 54 y.o.   MRN: [id number]   Chief Complaint  Patient presents with  . Shoulder Pain    patient complains of left upper arm and shoulder blade pain x 6 weeks. Sore for awhile after covid shot in March.    Subjective:   Left upper arm/shoulder pain for past 6 wks. March had covid vaccine. Left shoulder pain lingered for a few more days.  Now having pain in scapula area. No new exercising or heavy lifting. No previous trauma or surgery to shoulder.   No pain down arm or numbness. meds-tried tylenol pm, once. Haven't taken nsaids. No neck pain.  Medical History Corey [name] has a past medical history of Essential hypertension, Hypertriglyceridemia, and Type 2 diabetes mellitus ([place]).   Outpatient Encounter Medications as of 04/08/2020  Medication Sig  . aspirin 81 MG EC tablet Take 1 tablet (81 mg total) by mouth daily.  Marland Kitchen atorvastatin (LIPITOR) 20 MG tablet Take 1 tablet by mouth daily  . blood glucose meter kit and supplies KIT Dispense based on patient and insurance preference. Test blood sugar once daily. Type 2 diabetes. E11.9  . canagliflozin (INVOKANA) 300 MG TABS tablet TAKE 1 TABLET BY MOUTH ONCE DAILY BEFORE BREAKFAST  . fenofibrate 160 MG tablet Take 1 tablet (160 mg total) by mouth daily.  Marland Kitchen glipiZIDE (GLUCOTROL) 5 MG tablet TAKE 1 TABLET BY MOUTH IN THE MORNING AND 2 IN THE EVENING  . glucose blood (CONTOUR NEXT TEST) test strip USE 1 STRIP TO CHECK GLUCOSE ONCE DAILY  . ibuprofen (ADVIL) 800 MG tablet Take 1 tablet (800 mg total) by mouth every 8 (eight) hours as needed. Take with food.  Marland Kitchen losartan (COZAAR) 100 MG tablet Take 1 tablet by mouth once daily  . metFORMIN (GLUCOPHAGE) 500 MG tablet TAKE 2 TABLETS BY MOUTH ONCE DAILY IN THE MORNING, 1 TABLET AT NOON, AND  2 TABLETS  IN THE EVENING  . metoprolol tartrate (LOPRESSOR) 25 MG tablet Take 1 tablet (25 mg total) by mouth 2 (two) times daily. (Patient not taking: Reported on  09/16/2019)   No facility-administered encounter medications on file as of 04/08/2020.     Review of Systems  Constitutional: Negative for chills and fever.  HENT: Negative for congestion, rhinorrhea and sore throat.   Respiratory: Negative for cough, shortness of breath and wheezing.   Cardiovascular: Negative for chest pain and leg swelling.  Gastrointestinal: Negative for abdominal pain, diarrhea, nausea and vomiting.  Genitourinary: Negative for dysuria and frequency.  Musculoskeletal: Positive for arthralgias (left shoulder pain). Negative for back pain, neck pain and neck stiffness.  Skin: Negative for rash.  Neurological: Negative for dizziness, weakness and headaches.     Vitals BP (!) 154/88   Pulse 99   Temp 97.7 F (36.5 C)   Wt 201 lb 6.4 oz (91.4 kg)   SpO2 99%   BMI 31.54 kg/m   Objective:   Physical Exam Vitals and nursing note reviewed.  Constitutional:      General: He is not in acute distress.    Appearance: Normal appearance. He is not ill-appearing.  Cardiovascular:     Rate and Rhythm: Normal rate and regular rhythm.     Pulses: Normal pulses.     Heart sounds: Normal heart sounds.  Pulmonary:     Effort: Pulmonary effort is normal. No respiratory distress.     Breath sounds: Normal breath sounds.  Musculoskeletal:  General: Normal range of motion.     Cervical back: Normal range of motion and neck supple. No tenderness.     Comments: +ttp in left shoulder posterior. Normal rom of left shoulder. Neg- Neer's on left, positive hawkins on left. Normal rom elbow, hand, neck.  Skin:    General: Skin is warm and dry.     Findings: No rash.  Neurological:     General: No focal deficit present.     Mental Status: He is alert and oriented to person, place, and time.     Cranial Nerves: No cranial nerve deficit.     Motor: No weakness.  Psychiatric:        Mood and Affect: Mood normal.        Behavior: Behavior normal.        Thought  Content: Thought content normal.        Judgment: Judgment normal.      Assessment and Plan   1. Impingement syndrome of shoulder region, left   Exercises, heat/ice, handout given on shoulder pain. ibuprofen 889m tid with food. Can add tylenol, icy hot, biofreeze or lidocaine patch prn. Rest for 1 wk, and f/u if not improving.  Pt in agreement with plan.

## 2020-04-08 NOTE — Patient Instructions (Signed)

## 2020-04-15 ENCOUNTER — Other Ambulatory Visit: Payer: Self-pay | Admitting: Family Medicine

## 2020-04-27 ENCOUNTER — Other Ambulatory Visit: Payer: Self-pay | Admitting: Family Medicine

## 2020-04-27 DIAGNOSIS — M7542 Impingement syndrome of left shoulder: Secondary | ICD-10-CM

## 2020-04-28 ENCOUNTER — Other Ambulatory Visit: Payer: Self-pay | Admitting: Family Medicine

## 2020-05-06 DIAGNOSIS — I10 Essential (primary) hypertension: Secondary | ICD-10-CM | POA: Diagnosis not present

## 2020-05-06 DIAGNOSIS — E119 Type 2 diabetes mellitus without complications: Secondary | ICD-10-CM | POA: Diagnosis not present

## 2020-05-06 DIAGNOSIS — E785 Hyperlipidemia, unspecified: Secondary | ICD-10-CM | POA: Diagnosis not present

## 2020-05-07 LAB — HEMOGLOBIN A1C
Est. average glucose Bld gHb Est-mCnc: 249 mg/dL
Hgb A1c MFr Bld: 10.3 % — ABNORMAL HIGH (ref 4.8–5.6)

## 2020-05-07 LAB — COMPREHENSIVE METABOLIC PANEL
ALT: 25 IU/L (ref 0–44)
AST: 19 IU/L (ref 0–40)
Albumin/Globulin Ratio: 1.8 (ref 1.2–2.2)
Albumin: 4.8 g/dL (ref 3.8–4.9)
Alkaline Phosphatase: 31 IU/L — ABNORMAL LOW (ref 48–121)
BUN/Creatinine Ratio: 9 (ref 9–20)
BUN: 10 mg/dL (ref 6–24)
Bilirubin Total: 0.4 mg/dL (ref 0.0–1.2)
CO2: 20 mmol/L (ref 20–29)
Calcium: 10.1 mg/dL (ref 8.7–10.2)
Chloride: 108 mmol/L — ABNORMAL HIGH (ref 96–106)
Creatinine, Ser: 1.08 mg/dL (ref 0.76–1.27)
GFR calc Af Amer: 88 mL/min/{1.73_m2} (ref >59)
GFR calc non Af Amer: 76 mL/min/{1.73_m2} (ref >59)
Globulin, Total: 2.7 g/dL (ref 1.5–4.5)
Glucose: 191 mg/dL — ABNORMAL HIGH (ref 65–99)
Potassium: 4.4 mmol/L (ref 3.5–5.2)
Sodium: 140 mmol/L (ref 134–144)
Total Protein: 7.5 g/dL (ref 6.0–8.5)

## 2020-05-07 LAB — CBC WITH DIFFERENTIAL/PLATELET
Basophils Absolute: 0 10*3/uL (ref 0.0–0.2)
Basos: 1 %
EOS (ABSOLUTE): 0.1 10*3/uL (ref 0.0–0.4)
Eos: 2 %
Hematocrit: 37.4 % — ABNORMAL LOW (ref 37.5–51.0)
Hemoglobin: 12.6 g/dL — ABNORMAL LOW (ref 13.0–17.7)
Immature Grans (Abs): 0 10*3/uL (ref 0.0–0.1)
Immature Granulocytes: 0 %
Lymphocytes Absolute: 1.2 10*3/uL (ref 0.7–3.1)
Lymphs: 42 %
MCH: 27.4 pg (ref 26.6–33.0)
MCHC: 33.7 g/dL (ref 31.5–35.7)
MCV: 81 fL (ref 79–97)
Monocytes Absolute: 0.3 10*3/uL (ref 0.1–0.9)
Monocytes: 11 %
Neutrophils Absolute: 1.3 10*3/uL — ABNORMAL LOW (ref 1.4–7.0)
Neutrophils: 44 %
Platelets: 243 10*3/uL (ref 150–450)
RBC: 4.6 x10E6/uL (ref 4.14–5.80)
RDW: 14.5 % (ref 11.6–15.4)
WBC: 3 10*3/uL — ABNORMAL LOW (ref 3.4–10.8)

## 2020-05-07 LAB — LIPID PANEL
Chol/HDL Ratio: 7.3 ratio — ABNORMAL HIGH (ref 0.0–5.0)
Cholesterol, Total: 161 mg/dL (ref 100–199)
HDL: 22 mg/dL — ABNORMAL LOW (ref >39)
LDL Chol Calc (NIH): 69 mg/dL (ref 0–99)
Triglycerides: 446 mg/dL — ABNORMAL HIGH (ref 0–149)
VLDL Cholesterol Cal: 70 mg/dL — ABNORMAL HIGH (ref 5–40)

## 2020-05-07 LAB — MICROALBUMIN / CREATININE URINE RATIO
Creatinine, Urine: 55.4 mg/dL
Microalb/Creat Ratio: 38 mg/g creat — ABNORMAL HIGH (ref 0–29)
Microalbumin, Urine: 20.9 ug/mL

## 2020-05-14 ENCOUNTER — Other Ambulatory Visit: Payer: Self-pay

## 2020-05-14 ENCOUNTER — Encounter: Payer: Self-pay | Admitting: Family Medicine

## 2020-05-14 ENCOUNTER — Ambulatory Visit (INDEPENDENT_AMBULATORY_CARE_PROVIDER_SITE_OTHER): Payer: BC Managed Care – PPO | Admitting: Family Medicine

## 2020-05-14 VITALS — BP 138/80 | HR 105 | Temp 97.7°F | Ht 67.0 in | Wt 203.0 lb

## 2020-05-14 DIAGNOSIS — E782 Mixed hyperlipidemia: Secondary | ICD-10-CM | POA: Diagnosis not present

## 2020-05-14 DIAGNOSIS — Z9114 Patient's other noncompliance with medication regimen: Secondary | ICD-10-CM | POA: Diagnosis not present

## 2020-05-14 DIAGNOSIS — E1165 Type 2 diabetes mellitus with hyperglycemia: Secondary | ICD-10-CM | POA: Diagnosis not present

## 2020-05-14 DIAGNOSIS — I10 Essential (primary) hypertension: Secondary | ICD-10-CM

## 2020-05-14 MED ORDER — CANAGLIFLOZIN 300 MG PO TABS: 300.0000 mg | ORAL_TABLET | Freq: Every day | ORAL | 0 refills | Status: DC

## 2020-05-14 MED ORDER — ATORVASTATIN CALCIUM 20 MG PO TABS: ORAL_TABLET | ORAL | 0 refills | Status: DC

## 2020-05-14 MED ORDER — METFORMIN HCL 500 MG PO TABS: ORAL_TABLET | ORAL | 1 refills | Status: DC

## 2020-05-14 NOTE — Progress Notes (Signed)
Patient ID: Corey [name], male    DOB: May 24, 1966, 54 y.o.   MRN: [id number]   Chief Complaint  Patient presents with  . Diabetes   Subjective:    HPIFollow up on diabetes. A1C was 10.3 on recent bloodwork. Pt states his diet has not been good and has missed some doses of his meds.   Was taking metfromin.  Not compliant with the dosing. Lack of exercise and not good diet and missing the doses. Past 6 months having some life changing events. 12/20 had covid virus. Wife and pt had covid, and brother and brother in law passed.  Never seen by specialist for diabetes.  Left shoulder pain, hard to exercise recently. Taking ibuprofen and heating pad and is improving. Missing noon tablet of metfromin.  invokanna is free for pt, getting assistance. And metfrmin and glipizide affordable.  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  Pt has glucometer but not checking. Checking bg in am- 160s.  Eye- doctor in the next 1-2 months.  hld- taking fenofibrate and lipitor. Htn- taking losartan.  No SEs.  Medical History Corey [name] has a past medical history of Essential hypertension, Hypertriglyceridemia, and Type 2 diabetes mellitus (Corey [name]).   Outpatient Encounter Medications as of 05/14/2020  Medication Sig  . aspirin 81 MG EC tablet Take 1 tablet (81 mg total) by mouth daily.  Marland Kitchen atorvastatin (LIPITOR) 20 MG tablet Take 1 tablet by mouth daily  . blood glucose meter kit and supplies KIT Dispense based on patient and insurance preference. Test blood sugar once daily. Type 2 diabetes. E11.9  . canagliflozin (INVOKANA) 300 MG TABS tablet Take 1 tablet (300 mg total) by mouth daily before breakfast.  . fenofibrate 160 MG tablet Take 1 tablet by mouth once daily  . glipiZIDE (GLUCOTROL) 5 MG tablet TAKE 1 TABLET BY MOUTH IN THE MORNING AND 2 IN THE EVENING  . glucose blood (CONTOUR NEXT TEST) test strip USE 1 STRIP TO CHECK  GLUCOSE ONCE DAILY  . losartan (COZAAR) 100 MG tablet Take 1 tablet by mouth once daily  . metFORMIN (GLUCOPHAGE) 500 MG tablet TAKE 2 TABLETS BY MOUTH ONCE DAILY IN THE MORNING, 1 TABLET AT NOON, AND  2 TABLETS  IN THE EVENING  . [DISCONTINUED] atorvastatin (LIPITOR) 20 MG tablet Take 1 tablet by mouth daily  . [DISCONTINUED] ibuprofen (ADVIL) 800 MG tablet TAKE 1 TABLET BY MOUTH EVERY 8 HOURS AS NEEDED WITH FOOD  . [DISCONTINUED] INVOKANA 300 MG TABS tablet TAKE 1 TABLET BY MOUTH ONCE DAILY BEFORE BREAKFAST  . [DISCONTINUED] metFORMIN (GLUCOPHAGE) 500 MG tablet TAKE 2 TABLETS BY MOUTH ONCE DAILY IN THE MORNING, 1 TABLET AT NOON, AND  2 TABLETS  IN THE EVENING  . [DISCONTINUED] metoprolol tartrate (LOPRESSOR) 25 MG tablet Take 1 tablet (25 mg total) by mouth 2 (two) times daily. (Patient not taking: Reported on 09/16/2019)   No facility-administered encounter medications on file as of 05/14/2020.     Review of Systems  Constitutional: Negative for chills and fever.  HENT: Negative for congestion, rhinorrhea and sore throat.   Respiratory: Negative for cough, shortness of breath and wheezing.   Cardiovascular: Negative for chest pain and leg swelling.  Gastrointestinal: Negative for abdominal pain, diarrhea, nausea and vomiting.  Endocrine: Negative for polydipsia, polyphagia and polyuria.  Genitourinary: Negative for dysuria and frequency.  Skin: Negative for rash.  Neurological: Negative for dizziness, weakness and  headaches.     Vitals BP (!) 138/80   Pulse 105   Temp 97.7 F (36.5 C)   Ht '5\' 7"'  (1.702 m)   Wt (!) 203 lb (92.1 kg)   SpO2 95%   BMI 31.79 kg/m   Objective:   Physical Exam Vitals and nursing note reviewed.  Constitutional:      General: He is not in acute distress.    Appearance: Normal appearance. He is not ill-appearing.  HENT:     Head: Normocephalic.     Nose: Nose normal. No congestion.     Mouth/Throat:     Mouth: Mucous membranes are moist.      Pharynx: No oropharyngeal exudate.  Eyes:     Extraocular Movements: Extraocular movements intact.     Conjunctiva/sclera: Conjunctivae normal.     Pupils: Pupils are equal, round, and reactive to light.  Cardiovascular:     Rate and Rhythm: Normal rate and regular rhythm.     Pulses: Normal pulses.     Heart sounds: Normal heart sounds. No murmur heard.   Pulmonary:     Effort: Pulmonary effort is normal.     Breath sounds: Normal breath sounds. No wheezing, rhonchi or rales.  Musculoskeletal:        General: Normal range of motion.     Right lower leg: No edema.     Left lower leg: No edema.  Skin:    General: Skin is warm and dry.     Findings: No rash.  Neurological:     General: No focal deficit present.     Mental Status: He is alert and oriented to person, place, and time.     Cranial Nerves: No cranial nerve deficit.  Psychiatric:        Mood and Affect: Mood normal.        Behavior: Behavior normal.        Thought Content: Thought content normal.        Judgment: Judgment normal.      Assessment and Plan   1. Uncontrolled type 2 diabetes mellitus with hyperglycemia ([facility]) - metFORMIN (GLUCOPHAGE) 500 MG tablet; TAKE 2 TABLETS BY MOUTH ONCE DAILY IN THE MORNING, 1 TABLET AT NOON, AND  2 TABLETS  IN THE EVENING  Dispense: 450 tablet; Refill: 1 - canagliflozin (INVOKANA) 300 MG TABS tablet; Take 1 tablet (300 mg total) by mouth daily before breakfast.  Dispense: 90 tablet; Refill: 0  2. Noncompliance with medication regimen  3. Essential hypertension, benign  4. Mixed hyperlipidemia - atorvastatin (LIPITOR) 20 MG tablet; Take 1 tablet by mouth daily  Dispense: 90 tablet; Refill: 0   DM2- uncontrolled.  a1c was 8.6 and now at 10.3.  Advising to take the metformin as prescribed, cont with invokanna and glipizide.  If not controlled and watching diet then will need insulin on next visit.  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  Pt may need referral to endocrinology if not improving.  Pt declining diabetic educator at this time.  hld- triglycerides elevated, other cholesterol numbers stable. Cont with fenofibrate and lipitor.  htn- suboptimal.  Cont to monitor.  cont with losartan and watch salt intake.  F/u 3 months.

## 2020-05-14 NOTE — Patient Instructions (Addendum)
Glucose check 2x per day for next week, am fasting and at 9pm.  Call with numbers in 1 wk.   Make sure to take the medications as directed.   Work on diet- eating more protein and veggies. Drinking more water.   Call if seeing numbers over 250 or under 70.    Diabetes Mellitus and Nutrition, Adult When you have diabetes (diabetes mellitus), it is very important to have healthy eating habits because your blood sugar (glucose) levels are greatly affected by what you eat and drink. Eating healthy foods in the appropriate amounts, at about the same times every day, can help you:  Control your blood glucose.  Lower your risk of heart disease.  Improve your blood pressure.  Reach or maintain a healthy weight. Every person with diabetes is different, and each person has different needs for a meal plan. Your health care provider may recommend that you work with a diet and nutrition specialist (dietitian) to make a meal plan that is best for you. Your meal plan may vary depending on factors such as:  The calories you need.  The medicines you take.  Your weight.  Your blood glucose, blood pressure, and cholesterol levels.  Your activity level.  Other health conditions you have, such as heart or kidney disease. How do carbohydrates affect me? Carbohydrates, also called carbs, affect your blood glucose level more than any other type of food. Eating carbs naturally raises the amount of glucose in your blood. Carb counting is a method for keeping track of how many carbs you eat. Counting carbs is important to keep your blood glucose at a healthy level, especially if you use insulin or take certain oral diabetes medicines. It is important to know how many carbs you can safely have in each meal. This is different for every person. Your dietitian can help you calculate how many carbs you should have at each meal and for each snack. Foods that contain carbs include:  Bread, cereal, rice,  pasta, and crackers.  Potatoes and corn.  Peas, beans, and lentils.  Milk and yogurt.  Fruit and juice.  Desserts, such as cakes, cookies, ice cream, and candy. How does alcohol affect me? Alcohol can cause a sudden decrease in blood glucose (hypoglycemia), especially if you use insulin or take certain oral diabetes medicines. Hypoglycemia can be a life-threatening condition. Symptoms of hypoglycemia (sleepiness, dizziness, and confusion) are similar to symptoms of having too much alcohol. If your health care provider says that alcohol is safe for you, follow these guidelines:  Limit alcohol intake to no more than 1 drink per day for nonpregnant women and 2 drinks per day for men. One drink equals 12 oz of beer, 5 oz of wine, or 1 oz of hard liquor.  Do not drink on an empty stomach.  Keep yourself hydrated with water, diet soda, or unsweetened iced tea.  Keep in mind that regular soda, juice, and other mixers may contain a lot of sugar and must be counted as carbs. What are tips for following this plan?  Reading food labels  Start by checking the serving size on the "Nutrition Facts" label of packaged foods and drinks. The amount of calories, carbs, fats, and other nutrients listed on the label is based on one serving of the item. Many items contain more than one serving per package.  Check the total grams (g) of carbs in one serving. You can calculate the number of servings of carbs in one serving by  dividing the total carbs by 15. For example, if a food has 30 g of total carbs, it would be equal to 2 servings of carbs.  Check the number of grams (g) of saturated and trans fats in one serving. Choose foods that have low or no amount of these fats.  Check the number of milligrams (mg) of salt (sodium) in one serving. Most people should limit total sodium intake to less than 2,300 mg per day.  Always check the nutrition information of foods labeled as "low-fat" or "nonfat". These  foods may be higher in added sugar or refined carbs and should be avoided.  Talk to your dietitian to identify your daily goals for nutrients listed on the label. Shopping  Avoid buying canned, premade, or processed foods. These foods tend to be high in fat, sodium, and added sugar.  Shop around the outside edge of the grocery store. This includes fresh fruits and vegetables, bulk grains, fresh meats, and fresh dairy. Cooking  Use low-heat cooking methods, such as baking, instead of high-heat cooking methods like deep frying.  Cook using healthy oils, such as olive, canola, or sunflower oil.  Avoid cooking with butter, cream, or high-fat meats. Meal planning  Eat meals and snacks regularly, preferably at the same times every day. Avoid going long periods of time without eating.  Eat foods high in fiber, such as fresh fruits, vegetables, beans, and whole grains. Talk to your dietitian about how many servings of carbs you can eat at each meal.  Eat 4-6 ounces (oz) of lean protein each day, such as lean meat, chicken, fish, eggs, or tofu. One oz of lean protein is equal to: ? 1 oz of meat, chicken, or fish. ? 1 egg. ?  cup of tofu.  Eat some foods each day that contain healthy fats, such as avocado, nuts, seeds, and fish. Lifestyle  Check your blood glucose regularly.  Exercise regularly as told by your health care provider. This may include: ? 150 minutes of moderate-intensity or vigorous-intensity exercise each week. This could be brisk walking, biking, or water aerobics. ? Stretching and doing strength exercises, such as yoga or weightlifting, at least 2 times a week.  Take medicines as told by your health care provider.  Do not use any products that contain nicotine or tobacco, such as cigarettes and e-cigarettes. If you need help quitting, ask your health care provider.  Work with a Veterinary surgeon or diabetes educator to identify strategies to manage stress and any emotional and  social challenges. Questions to ask a health care provider  Do I need to meet with a diabetes educator?  Do I need to meet with a dietitian?  What number can I call if I have questions?  When are the best times to check my blood glucose? Where to find more information:  American Diabetes Association: diabetes.org  Academy of Nutrition and Dietetics: www.eatright.[employer] of Diabetes and Digestive and Kidney Diseases (NIH): CarFlippers.tn Summary  A healthy meal plan will help you control your blood glucose and maintain a healthy lifestyle.  Working with a diet and nutrition specialist (dietitian) can help you make a meal plan that is best for you.  Keep in mind that carbohydrates (carbs) and alcohol have immediate effects on your blood glucose levels. It is important to count carbs and to use alcohol carefully. This information is not intended to replace advice given to you by your health care provider. Make sure you discuss any questions you have  with your health care provider. Document Revised: 09/21/2017 Document Reviewed: 11/13/2016 Elsevier Patient Education  2020 [employer].

## 2020-05-17 ENCOUNTER — Other Ambulatory Visit: Payer: Self-pay | Admitting: Family Medicine

## 2020-05-17 DIAGNOSIS — M7542 Impingement syndrome of left shoulder: Secondary | ICD-10-CM

## 2020-05-20 DIAGNOSIS — Z9114 Patient's other noncompliance with medication regimen: Secondary | ICD-10-CM | POA: Insufficient documentation

## 2020-05-20 DIAGNOSIS — Z91148 Patient's other noncompliance with medication regimen for other reason: Secondary | ICD-10-CM | POA: Insufficient documentation

## 2020-05-21 ENCOUNTER — Telehealth: Payer: Self-pay | Admitting: Family Medicine

## 2020-05-21 NOTE — Telephone Encounter (Signed)
Pt calling to report fasting blood sugars for the last week  7/25 AM 150  PM 166 7/26 AM 145  PM 199 (forgot to take pill at noon)  7/27 AM 175  PM 160 7/28 AM 165  PM 145 7/29 AM ?      PM 175 7/28 AM 142

## 2020-05-21 NOTE — Telephone Encounter (Signed)
Thanks for the numbers.  Looking much improved.   If he can continue with the medications and we can recheck his labs in 59months. (unless already has appt.)  Pls make appt for him.    Thx,  Dr. Karie Schwalbe

## 2020-05-21 NOTE — Telephone Encounter (Signed)
Lmtc

## 2020-05-26 ENCOUNTER — Other Ambulatory Visit: Payer: Self-pay | Admitting: *Deleted

## 2020-05-26 DIAGNOSIS — E119 Type 2 diabetes mellitus without complications: Secondary | ICD-10-CM

## 2020-05-26 DIAGNOSIS — I10 Essential (primary) hypertension: Secondary | ICD-10-CM

## 2020-05-26 DIAGNOSIS — E1165 Type 2 diabetes mellitus with hyperglycemia: Secondary | ICD-10-CM

## 2020-05-26 DIAGNOSIS — E782 Mixed hyperlipidemia: Secondary | ICD-10-CM

## 2020-05-26 NOTE — Telephone Encounter (Signed)
Patient notified of results. Labs ordered and patient will call back in a few weeks to schedule appointment.

## 2020-06-02 ENCOUNTER — Other Ambulatory Visit: Payer: Self-pay | Admitting: Family Medicine

## 2020-06-02 DIAGNOSIS — M7542 Impingement syndrome of left shoulder: Secondary | ICD-10-CM

## 2020-06-21 ENCOUNTER — Other Ambulatory Visit: Payer: Self-pay | Admitting: Family Medicine

## 2020-06-21 DIAGNOSIS — M7542 Impingement syndrome of left shoulder: Secondary | ICD-10-CM

## 2020-06-27 ENCOUNTER — Other Ambulatory Visit: Payer: Self-pay | Admitting: Family Medicine

## 2020-07-21 ENCOUNTER — Other Ambulatory Visit: Payer: Self-pay | Admitting: Family Medicine

## 2020-08-05 ENCOUNTER — Other Ambulatory Visit: Payer: Self-pay | Admitting: *Deleted

## 2020-08-05 DIAGNOSIS — I712 Thoracic aortic aneurysm, without rupture, unspecified: Secondary | ICD-10-CM

## 2020-08-09 ENCOUNTER — Telehealth (HOSPITAL_COMMUNITY): Payer: Self-pay | Admitting: Cardiothoracic Surgery

## 2020-08-09 ENCOUNTER — Other Ambulatory Visit: Payer: Self-pay | Admitting: *Deleted

## 2020-08-09 MED ORDER — GLIPIZIDE 5 MG PO TABS: ORAL_TABLET | ORAL | 0 refills | Status: DC

## 2020-08-09 NOTE — Telephone Encounter (Signed)
Called patient to schedule echocardiogram that you ordered and patient declined to schedule at this time. Order will be removed from the ECHO WQ and if patient calls back to schedule we will reinstate the order. Thank you.

## 2020-08-10 ENCOUNTER — Other Ambulatory Visit: Payer: Self-pay | Admitting: *Deleted

## 2020-08-10 DIAGNOSIS — I712 Thoracic aortic aneurysm, without rupture, unspecified: Secondary | ICD-10-CM

## 2020-08-20 DIAGNOSIS — E119 Type 2 diabetes mellitus without complications: Secondary | ICD-10-CM | POA: Diagnosis not present

## 2020-08-26 DIAGNOSIS — E782 Mixed hyperlipidemia: Secondary | ICD-10-CM | POA: Diagnosis not present

## 2020-08-26 DIAGNOSIS — E119 Type 2 diabetes mellitus without complications: Secondary | ICD-10-CM | POA: Diagnosis not present

## 2020-08-26 DIAGNOSIS — I10 Essential (primary) hypertension: Secondary | ICD-10-CM | POA: Diagnosis not present

## 2020-08-27 LAB — MICROALBUMIN / CREATININE URINE RATIO
Creatinine, Urine: 92.1 mg/dL
Microalb/Creat Ratio: 147 mg/g creat — ABNORMAL HIGH (ref 0–29)
Microalbumin, Urine: 135.7 ug/mL

## 2020-08-27 LAB — CBC WITH DIFFERENTIAL/PLATELET
Basophils Absolute: 0 10*3/uL (ref 0.0–0.2)
Basos: 1 %
EOS (ABSOLUTE): 0 10*3/uL (ref 0.0–0.4)
Eos: 0 %
Hematocrit: 40.7 % (ref 37.5–51.0)
Hemoglobin: 13.3 g/dL (ref 13.0–17.7)
Immature Grans (Abs): 0 10*3/uL (ref 0.0–0.1)
Immature Granulocytes: 0 %
Lymphocytes Absolute: 0.8 10*3/uL (ref 0.7–3.1)
Lymphs: 17 %
MCH: 27.3 pg (ref 26.6–33.0)
MCHC: 32.7 g/dL (ref 31.5–35.7)
MCV: 83 fL (ref 79–97)
Monocytes Absolute: 0.4 10*3/uL (ref 0.1–0.9)
Monocytes: 7 %
Neutrophils Absolute: 3.6 10*3/uL (ref 1.4–7.0)
Neutrophils: 75 %
Platelets: 211 10*3/uL (ref 150–450)
RBC: 4.88 x10E6/uL (ref 4.14–5.80)
RDW: 13.8 % (ref 11.6–15.4)
WBC: 4.8 10*3/uL (ref 3.4–10.8)

## 2020-08-27 LAB — CMP14+EGFR
ALT: 36 IU/L (ref 0–44)
AST: 26 IU/L (ref 0–40)
Albumin/Globulin Ratio: 1.6 (ref 1.2–2.2)
Albumin: 4.7 g/dL (ref 3.8–4.9)
Alkaline Phosphatase: 34 IU/L — ABNORMAL LOW (ref 44–121)
BUN/Creatinine Ratio: 13 (ref 9–20)
BUN: 14 mg/dL (ref 6–24)
Bilirubin Total: 0.4 mg/dL (ref 0.0–1.2)
CO2: 21 mmol/L (ref 20–29)
Calcium: 10.1 mg/dL (ref 8.7–10.2)
Chloride: 102 mmol/L (ref 96–106)
Creatinine, Ser: 1.08 mg/dL (ref 0.76–1.27)
GFR calc Af Amer: 88 mL/min/{1.73_m2} (ref >59)
GFR calc non Af Amer: 76 mL/min/{1.73_m2} (ref >59)
Globulin, Total: 3 g/dL (ref 1.5–4.5)
Glucose: 267 mg/dL — ABNORMAL HIGH (ref 65–99)
Potassium: 4.6 mmol/L (ref 3.5–5.2)
Sodium: 139 mmol/L (ref 134–144)
Total Protein: 7.7 g/dL (ref 6.0–8.5)

## 2020-08-27 LAB — LIPID PANEL
Chol/HDL Ratio: 7.3 ratio — ABNORMAL HIGH (ref 0.0–5.0)
Cholesterol, Total: 146 mg/dL (ref 100–199)
HDL: 20 mg/dL — ABNORMAL LOW (ref >39)
LDL Chol Calc (NIH): 61 mg/dL (ref 0–99)
Triglycerides: 420 mg/dL — ABNORMAL HIGH (ref 0–149)
VLDL Cholesterol Cal: 65 mg/dL — ABNORMAL HIGH (ref 5–40)

## 2020-08-27 LAB — HEMOGLOBIN A1C
Est. average glucose Bld gHb Est-mCnc: 240 mg/dL
Hgb A1c MFr Bld: 10 % — ABNORMAL HIGH (ref 4.8–5.6)

## 2020-08-27 MED ORDER — ALCOHOL PADS 70 % PADS: MEDICATED_PAD | 2 refills | Status: DC

## 2020-08-27 MED ORDER — INSULIN GLARGINE 100 UNIT/ML ~~LOC~~ SOLN: 10.0000 [IU] | Freq: Every day | SUBCUTANEOUS | 2 refills | Status: DC

## 2020-08-27 MED ORDER — "INSULIN SYRINGE 31G X 5/16"" 0.5 ML MISC": 1 refills | Status: DC

## 2020-08-27 NOTE — Progress Notes (Signed)
Left message to return call 

## 2020-08-27 NOTE — Addendum Note (Signed)
Addended by: Annalee Genta on: 08/27/2020 09:52 AM   Modules accepted: Orders

## 2020-08-27 NOTE — Progress Notes (Signed)
See note from result note.  Sending in lantus for pt to start tonight.  Pt needs to get blood glucose down, a1c very high at 10.   Starting lantus at 10 units nightly.  Pt to call if having problems with picking up due to cost or not sure how to use this.  Have pt bring into the office for instructions on administration if needed.   Pt has appt on 09/03/20. But pt can't wait till then to start on medication.  Pt to continue all other diabetic meds.   Dr. Ladona Ridgel

## 2020-09-01 NOTE — Progress Notes (Signed)
This was discussed with pt.  See result note. Pt also has appt in 2 days on  11/12.

## 2020-09-03 ENCOUNTER — Ambulatory Visit: Payer: BC Managed Care – PPO | Admitting: Family Medicine

## 2020-09-12 ENCOUNTER — Other Ambulatory Visit: Payer: Self-pay | Admitting: Family Medicine

## 2020-09-12 DIAGNOSIS — E782 Mixed hyperlipidemia: Secondary | ICD-10-CM

## 2020-09-13 ENCOUNTER — Ambulatory Visit: Payer: Self-pay | Admitting: Cardiothoracic Surgery

## 2020-09-13 ENCOUNTER — Other Ambulatory Visit: Payer: BLUE CROSS/BLUE SHIELD

## 2020-09-15 NOTE — Telephone Encounter (Signed)
pls make sure pt is taking both fenofibrate and atorvastatin for cholesterol.  Last visit had high triglycerides.  And to dec carbs in diet to help with triglycerides.  Last visit they were 400s, they need to be under 150s.     Thx, dr. Evann Koelzer 

## 2020-09-15 NOTE — Telephone Encounter (Signed)
pls make sure pt is taking both fenofibrate and atorvastatin for cholesterol.  Last visit had high triglycerides.  And to dec carbs in diet to help with triglycerides.  Last visit they were 400s, they need to be under 150s.     Thx, dr. Ladona Ridgel

## 2020-10-05 ENCOUNTER — Telehealth: Payer: Self-pay | Admitting: Family Medicine

## 2020-10-06 NOTE — Telephone Encounter (Signed)
SENT 30 DAY SUPPLY LOSARTAN. Dr. Ladona Ridgel

## 2020-10-06 NOTE — Telephone Encounter (Signed)
Pt was supposed to f/u in nov.  Not sure if was no-show or cancelled appt.  When can he come back, needing to f/u on diabetes and bp. Needs appt, then we cna give some refills.   Thx.  Dr. Ladona Ridgel

## 2020-10-06 NOTE — Telephone Encounter (Signed)
Please have pt schedule appointment. Thank you.

## 2020-10-21 NOTE — Telephone Encounter (Signed)
Left message to schedule appointment twice

## 2020-11-02 ENCOUNTER — Other Ambulatory Visit: Payer: Self-pay | Admitting: Family Medicine

## 2020-11-11 ENCOUNTER — Other Ambulatory Visit: Payer: Self-pay | Admitting: Family Medicine

## 2020-11-22 ENCOUNTER — Other Ambulatory Visit: Payer: Self-pay | Admitting: Family Medicine

## 2020-12-09 ENCOUNTER — Telehealth: Payer: Self-pay | Admitting: Family Medicine

## 2020-12-09 DIAGNOSIS — E1165 Type 2 diabetes mellitus with hyperglycemia: Secondary | ICD-10-CM

## 2020-12-09 DIAGNOSIS — Z79899 Other long term (current) drug therapy: Secondary | ICD-10-CM

## 2020-12-09 DIAGNOSIS — E785 Hyperlipidemia, unspecified: Secondary | ICD-10-CM

## 2020-12-09 DIAGNOSIS — I10 Essential (primary) hypertension: Secondary | ICD-10-CM

## 2020-12-09 DIAGNOSIS — E119 Type 2 diabetes mellitus without complications: Secondary | ICD-10-CM

## 2020-12-09 NOTE — Telephone Encounter (Signed)
Patient has appointment needing labs for his follow up appointment.

## 2020-12-09 NOTE — Telephone Encounter (Signed)
Yes pls reorder. Thx.

## 2020-12-09 NOTE — Telephone Encounter (Signed)
Last labs 08/26/20 Cmp, cbc, a1c, lipid. Urine acr

## 2020-12-10 NOTE — Telephone Encounter (Signed)
Bw orders put in and pt was notified.  

## 2020-12-15 ENCOUNTER — Ambulatory Visit: Payer: BC Managed Care – PPO | Admitting: Family Medicine

## 2020-12-16 ENCOUNTER — Ambulatory Visit: Payer: BC Managed Care – PPO | Admitting: Family Medicine

## 2020-12-16 DIAGNOSIS — E119 Type 2 diabetes mellitus without complications: Secondary | ICD-10-CM | POA: Diagnosis not present

## 2020-12-16 DIAGNOSIS — Z79899 Other long term (current) drug therapy: Secondary | ICD-10-CM | POA: Diagnosis not present

## 2020-12-16 DIAGNOSIS — E785 Hyperlipidemia, unspecified: Secondary | ICD-10-CM | POA: Diagnosis not present

## 2020-12-17 LAB — CBC WITH DIFFERENTIAL/PLATELET
Basophils Absolute: 0 10*3/uL (ref 0.0–0.2)
Basos: 0 %
EOS (ABSOLUTE): 0.1 10*3/uL (ref 0.0–0.4)
Eos: 2 %
Hematocrit: 36.9 % — ABNORMAL LOW (ref 37.5–51.0)
Hemoglobin: 12.3 g/dL — ABNORMAL LOW (ref 13.0–17.7)
Immature Grans (Abs): 0 10*3/uL (ref 0.0–0.1)
Immature Granulocytes: 0 %
Lymphocytes Absolute: 1 10*3/uL (ref 0.7–3.1)
Lymphs: 42 %
MCH: 28 pg (ref 26.6–33.0)
MCHC: 33.3 g/dL (ref 31.5–35.7)
MCV: 84 fL (ref 79–97)
Monocytes Absolute: 0.3 10*3/uL (ref 0.1–0.9)
Monocytes: 13 %
Neutrophils Absolute: 1.1 10*3/uL — ABNORMAL LOW (ref 1.4–7.0)
Neutrophils: 43 %
Platelets: 221 10*3/uL (ref 150–450)
RBC: 4.4 x10E6/uL (ref 4.14–5.80)
RDW: 13.6 % (ref 11.6–15.4)
WBC: 2.5 10*3/uL — CL (ref 3.4–10.8)

## 2020-12-17 LAB — COMPREHENSIVE METABOLIC PANEL
ALT: 27 IU/L (ref 0–44)
AST: 31 IU/L (ref 0–40)
Albumin/Globulin Ratio: 1.7 (ref 1.2–2.2)
Albumin: 4.6 g/dL (ref 3.8–4.9)
Alkaline Phosphatase: 36 IU/L — ABNORMAL LOW (ref 44–121)
BUN/Creatinine Ratio: 11 (ref 9–20)
BUN: 12 mg/dL (ref 6–24)
Bilirubin Total: 0.3 mg/dL (ref 0.0–1.2)
CO2: 20 mmol/L (ref 20–29)
Calcium: 8.8 mg/dL (ref 8.7–10.2)
Chloride: 103 mmol/L (ref 96–106)
Creatinine, Ser: 1.07 mg/dL (ref 0.76–1.27)
GFR calc Af Amer: 89 mL/min/{1.73_m2} (ref >59)
GFR calc non Af Amer: 77 mL/min/{1.73_m2} (ref >59)
Globulin, Total: 2.7 g/dL (ref 1.5–4.5)
Glucose: 280 mg/dL — ABNORMAL HIGH (ref 65–99)
Potassium: 4.4 mmol/L (ref 3.5–5.2)
Sodium: 139 mmol/L (ref 134–144)
Total Protein: 7.3 g/dL (ref 6.0–8.5)

## 2020-12-17 LAB — HEMOGLOBIN A1C
Est. average glucose Bld gHb Est-mCnc: 269 mg/dL
Hgb A1c MFr Bld: 11 % — ABNORMAL HIGH (ref 4.8–5.6)

## 2020-12-17 LAB — LIPID PANEL
Chol/HDL Ratio: 4.4 ratio (ref 0.0–5.0)
Cholesterol, Total: 115 mg/dL (ref 100–199)
HDL: 26 mg/dL — ABNORMAL LOW (ref >39)
LDL Chol Calc (NIH): 54 mg/dL (ref 0–99)
Triglycerides: 216 mg/dL — ABNORMAL HIGH (ref 0–149)
VLDL Cholesterol Cal: 35 mg/dL (ref 5–40)

## 2020-12-17 LAB — MICROALBUMIN / CREATININE URINE RATIO
Creatinine, Urine: 132.2 mg/dL
Microalb/Creat Ratio: 67 mg/g creat — ABNORMAL HIGH (ref 0–29)
Microalbumin, Urine: 88.6 ug/mL

## 2020-12-18 IMAGING — CT CT ANGIO CHEST
2 series · 19 of 32 positions shown · IV contrast (APPLIED)
Comparison: July 14, 2017.

CLINICAL DATA: Thoracic aortic aneurysm.

EXAM:
CT ANGIOGRAPHY CHEST WITH CONTRAST
TECHNIQUE: Multidetector CT imaging of the chest was performed using the
standard protocol during bolus administration of intravenous
contrast. Multiplanar CT image reconstructions and MIPs were
obtained to evaluate the vascular anatomy.
CONTRAST:  75mL [id number]-9BT IOPAMIDOL ([id number]) INJECTION 76%

[Series 4: chest angio · axial · 0.83mm/px · z∈[-314,-56]mm · 11 of 104 slices shown]
[im 9/104  lung]
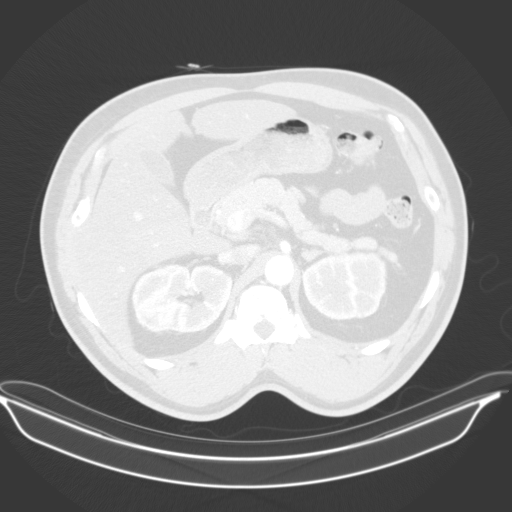
[im 18/104  soft-tissue]
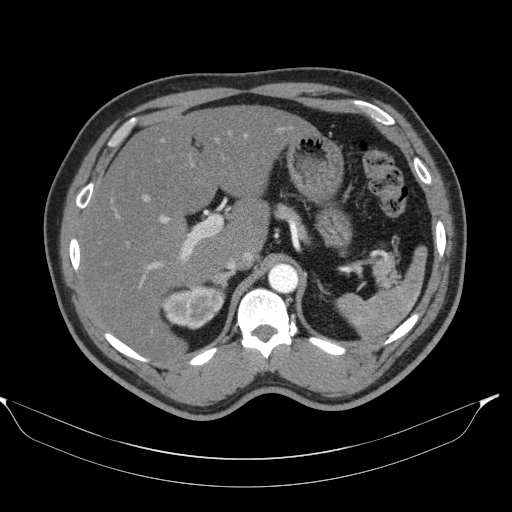
[im 26/104  lung]
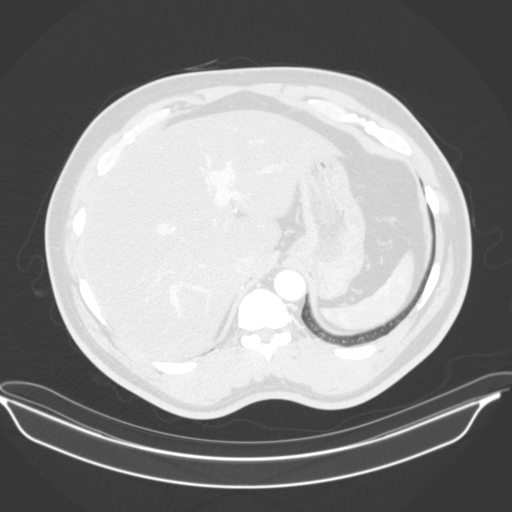
[im 35/104  soft-tissue]
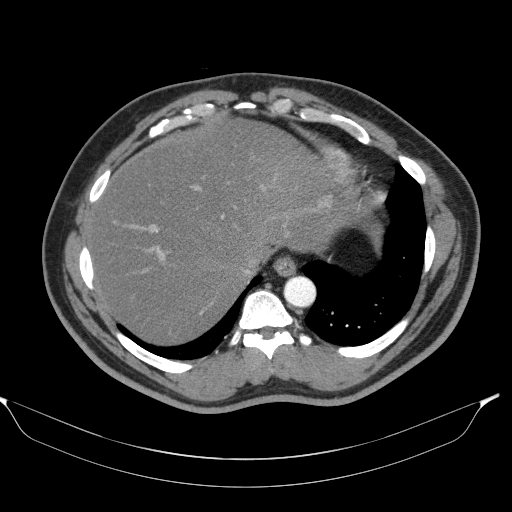
[im 43/104  lung]
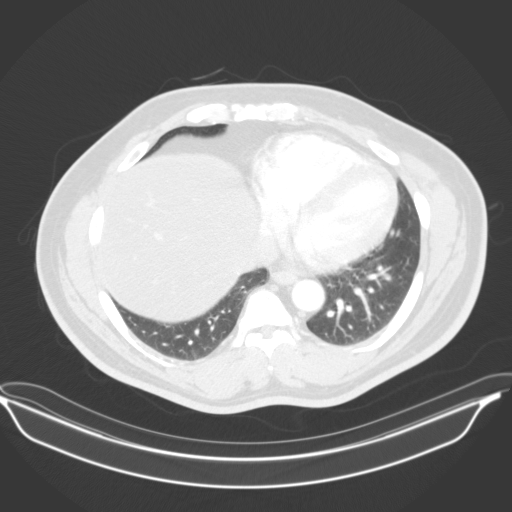
[im 52/104  soft-tissue]
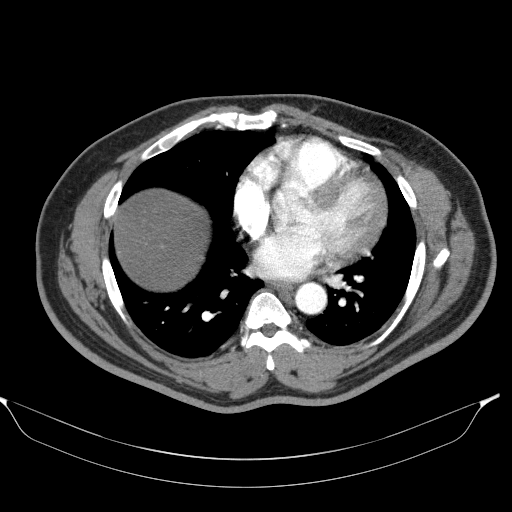
[im 61/104  lung]
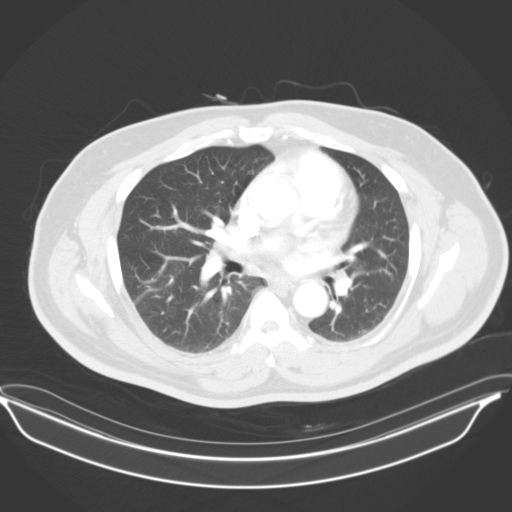
[im 69/104  soft-tissue]
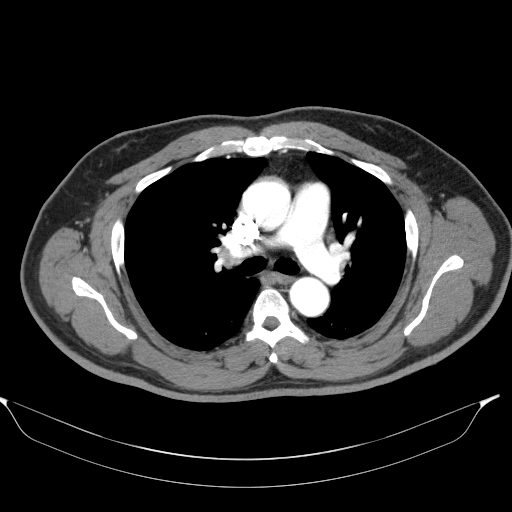
[im 78/104  lung]
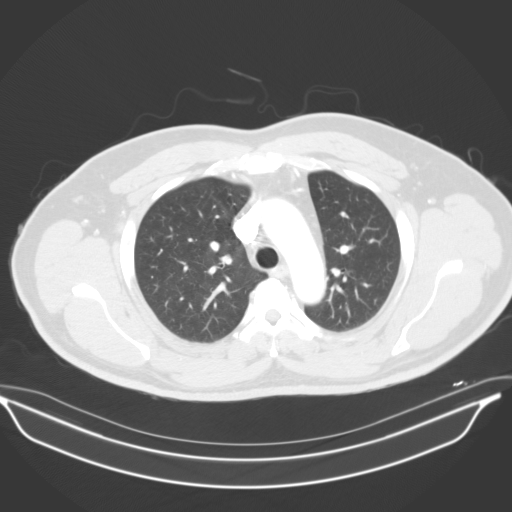
[im 86/104  soft-tissue]
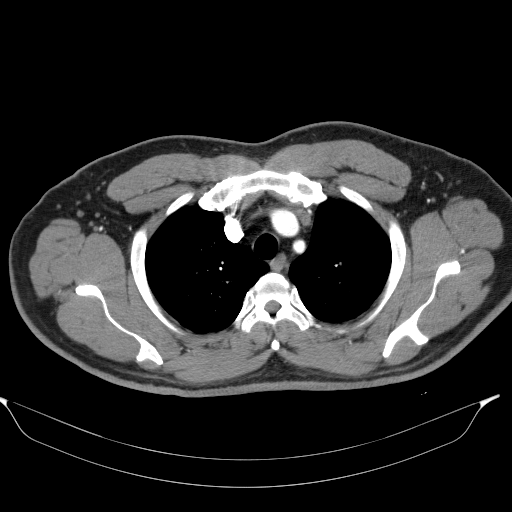
[im 95/104  lung]
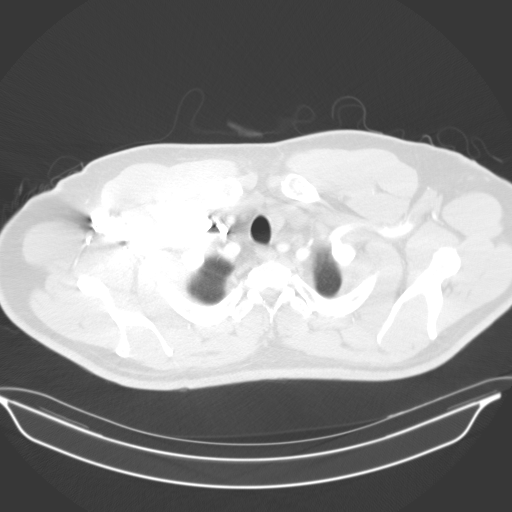

[Series 7: lung · axial · 0.83mm/px · z∈[-298,-62]mm · 8 of 152 slices shown]
[im 17/152  soft-tissue]
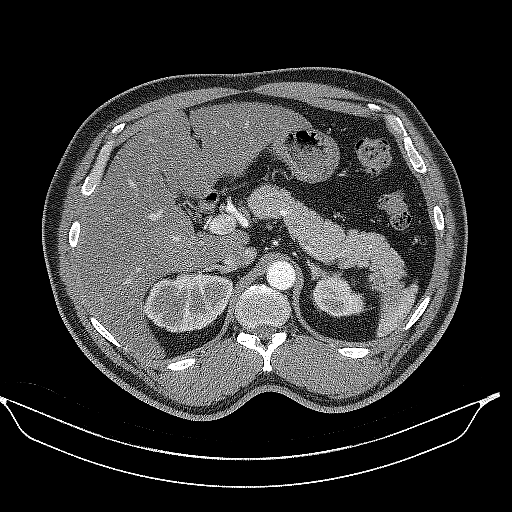
[im 34/152  soft-tissue]
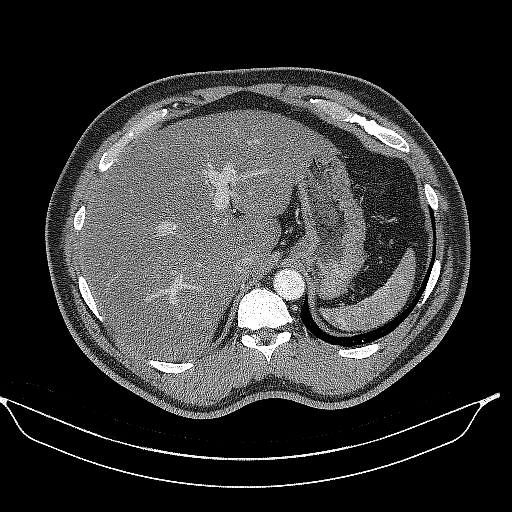
[im 51/152  soft-tissue]
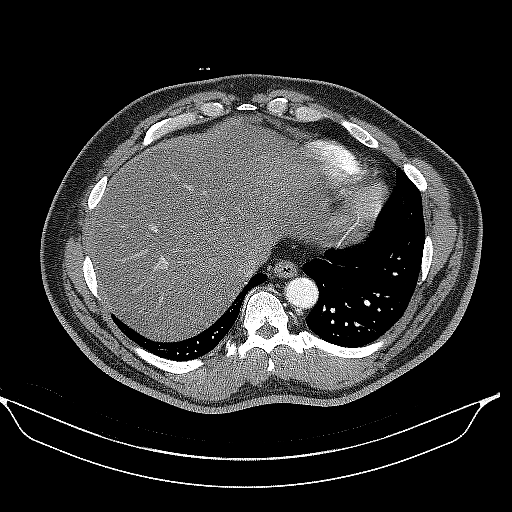
[im 68/152  soft-tissue]
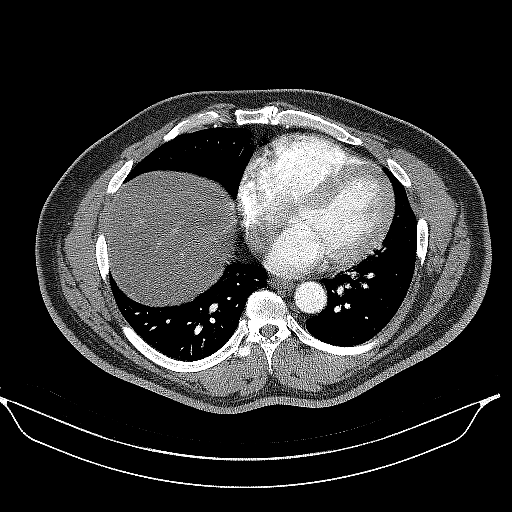
[im 84/152  soft-tissue]
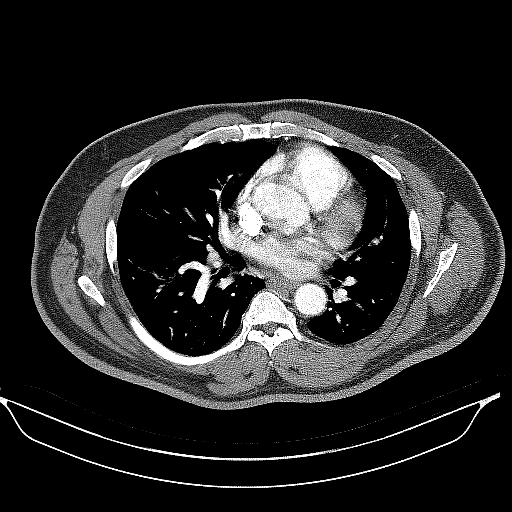
[im 101/152  soft-tissue]
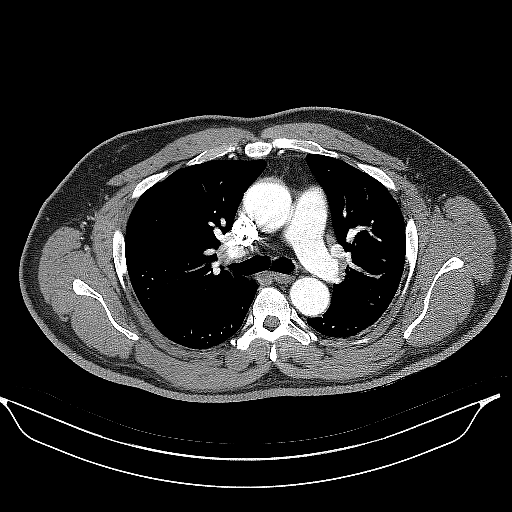
[im 118/152  soft-tissue]
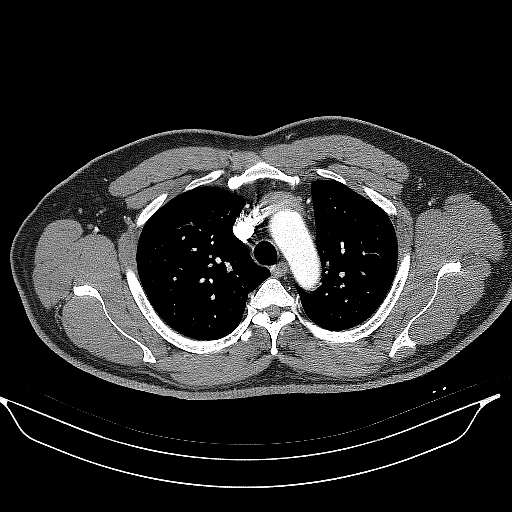
[im 135/152  soft-tissue]
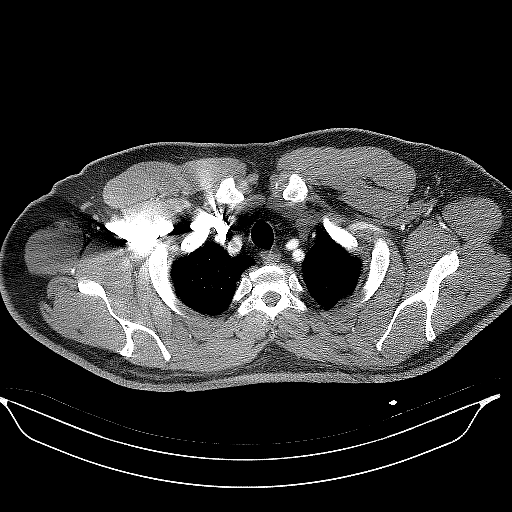

[19 of 32 positions shown; findings below may reference images not displayed]

FINDINGS: Cardiovascular: Grossly stable 4.0 cm ascending thoracic aortic
aneurysm is noted. Transverse aortic arch measures 2.8 cm. Proximal
descending thoracic aorta measures 3.4 cm. No dissection is noted.
Great vessels are widely patent without significant stenosis. Normal
cardiac size. No pericardial effusion.

Mediastinum/Nodes: No enlarged mediastinal, hilar, or axillary lymph
nodes. Thyroid gland, trachea, and esophagus demonstrate no
significant findings.

Lungs/Pleura: Lungs are clear. No pleural effusion or pneumothorax.

Upper Abdomen: Hepatic steatosis is noted.

Musculoskeletal: No chest wall abnormality. No acute or significant
osseous findings.

Review of the MIP images confirms the above findings.
IMPRESSION: 1. Grossly stable 4.0 cm ascending thoracic aortic aneurysm is
noted. Recommend annual imaging followup by CTA or MRA. This
recommendation follows 1151
ACCF/AHA/AATS/ACR/ASA/SCA/ELLEASON/VICTOR M/INGUNA/PRISCILA Guidelines for the
Diagnosis and Management of Patients with Thoracic Aortic Disease.
Circulation. 1151; 121: E266-e369. Aortic aneurysm NOS ([id number])
2. Hepatic steatosis.

## 2020-12-20 ENCOUNTER — Other Ambulatory Visit: Payer: Self-pay | Admitting: *Deleted

## 2020-12-20 ENCOUNTER — Telehealth: Payer: Self-pay | Admitting: Family Medicine

## 2020-12-20 NOTE — Telephone Encounter (Signed)
Discussed with pt. Pt verbalized understanding.  °

## 2020-12-20 NOTE — Telephone Encounter (Signed)
Left message to return call 

## 2020-12-20 NOTE — Telephone Encounter (Signed)
Pt states he is not taking invokana taht is on his med list so I removed it. States it was stopped a couple of months ago. He is taking lantus, metformin and glipizide as directed on med list. States he takes every day.

## 2020-12-20 NOTE — Telephone Encounter (Signed)
Pls call pt and tell him to take 30 units lantus starting tonight.  Check bg 2x per day and bring numbers when coming to appt. Thx. Dr. Ladona Ridgel

## 2020-12-22 ENCOUNTER — Encounter: Payer: Self-pay | Admitting: Family Medicine

## 2020-12-22 ENCOUNTER — Other Ambulatory Visit: Payer: Self-pay

## 2020-12-22 ENCOUNTER — Ambulatory Visit: Payer: BC Managed Care – PPO | Admitting: Family Medicine

## 2020-12-22 VITALS — BP 159/88 | HR 81 | Temp 97.4°F | Wt 206.6 lb

## 2020-12-22 DIAGNOSIS — Z9114 Patient's other noncompliance with medication regimen: Secondary | ICD-10-CM | POA: Diagnosis not present

## 2020-12-22 DIAGNOSIS — E1129 Type 2 diabetes mellitus with other diabetic kidney complication: Secondary | ICD-10-CM | POA: Diagnosis not present

## 2020-12-22 DIAGNOSIS — E1165 Type 2 diabetes mellitus with hyperglycemia: Secondary | ICD-10-CM | POA: Diagnosis not present

## 2020-12-22 DIAGNOSIS — R809 Proteinuria, unspecified: Secondary | ICD-10-CM

## 2020-12-22 DIAGNOSIS — E782 Mixed hyperlipidemia: Secondary | ICD-10-CM | POA: Diagnosis not present

## 2020-12-22 DIAGNOSIS — I10 Essential (primary) hypertension: Secondary | ICD-10-CM

## 2020-12-22 MED ORDER — HYDROCHLOROTHIAZIDE 25 MG PO TABS: 25.0000 mg | ORAL_TABLET | Freq: Every day | ORAL | 1 refills | Status: DC

## 2020-12-22 NOTE — Patient Instructions (Addendum)
Results for orders placed or performed in visit on 12/09/20  CBC with Differential/Platelet  Result Value Ref Range   WBC 2.5 (LL) 3.4 - 10.8 x10E3/uL   RBC 4.40 4.14 - 5.80 x10E6/uL   Hemoglobin 12.3 (L) 13.0 - 17.7 g/dL   Hematocrit 93.8 (L) 10.1 - 51.0 %   MCV 84 79 - 97 fL   MCH 28.0 26.6 - 33.0 pg   MCHC 33.3 31.5 - 35.7 g/dL   RDW 75.1 02.5 - 85.2 %   Platelets 221 150 - 450 x10E3/uL   Neutrophils 43 Not Estab. %   Lymphs 42 Not Estab. %   Monocytes 13 Not Estab. %   Eos 2 Not Estab. %   Basos 0 Not Estab. %   Neutrophils Absolute 1.1 (L) 1.4 - 7.0 x10E3/uL   Lymphocytes Absolute 1.0 0.7 - 3.1 x10E3/uL   Monocytes Absolute 0.3 0.1 - 0.9 x10E3/uL   EOS (ABSOLUTE) 0.1 0.0 - 0.4 x10E3/uL   Basophils Absolute 0.0 0.0 - 0.2 x10E3/uL   Immature Granulocytes 0 Not Estab. %   Immature Grans (Abs) 0.0 0.0 - 0.1 x10E3/uL  Hemoglobin A1c  Result Value Ref Range   Hgb A1c MFr Bld 11.0 (H) 4.8 - 5.6 %   Est. average glucose Bld gHb Est-mCnc 269 mg/dL  Comprehensive metabolic panel  Result Value Ref Range   Glucose 280 (H) 65 - 99 mg/dL   BUN 12 6 - 24 mg/dL   Creatinine, Ser 7.78 0.76 - 1.27 mg/dL   GFR calc non Af Amer 77 >59 mL/min/1.73   GFR calc Af Amer 89 >59 mL/min/1.73   BUN/Creatinine Ratio 11 9 - 20   Sodium 139 134 - 144 mmol/L   Potassium 4.4 3.5 - 5.2 mmol/L   Chloride 103 96 - 106 mmol/L   CO2 20 20 - 29 mmol/L   Calcium 8.8 8.7 - 10.2 mg/dL   Total Protein 7.3 6.0 - 8.5 g/dL   Albumin 4.6 3.8 - 4.9 g/dL   Globulin, Total 2.7 1.5 - 4.5 g/dL   Albumin/Globulin Ratio 1.7 1.2 - 2.2   Bilirubin Total 0.3 0.0 - 1.2 mg/dL   Alkaline Phosphatase 36 (L) 44 - 121 IU/L   AST 31 0 - 40 IU/L   ALT 27 0 - 44 IU/L  Lipid panel  Result Value Ref Range   Cholesterol, Total 115 100 - 199 mg/dL   Triglycerides 242 (H) 0 - 149 mg/dL   HDL 26 (L) >35 mg/dL   VLDL Cholesterol Cal 35 5 - 40 mg/dL   LDL Chol Calc (NIH) 54 0 - 99 mg/dL   Chol/HDL Ratio 4.4 0.0 - 5.0 ratio   Microalbumin / creatinine urine ratio  Result Value Ref Range   Creatinine, Urine 132.2 Not Estab. mg/dL   Microalbumin, Urine 36.1 Not Estab. ug/mL   Microalb/Creat Ratio 67 (H) 0 - 29 mg/g creat    Diabetes Mellitus and Nutrition, Adult When you have diabetes, or diabetes mellitus, it is very important to have healthy eating habits because your blood sugar (glucose) levels are greatly affected by what you eat and drink. Eating healthy foods in the right amounts, at about the same times every day, can help you:  Control your blood glucose.  Lower your risk of heart disease.  Improve your blood pressure.  Reach or maintain a healthy weight. What can affect my meal plan? Every person with diabetes is different, and each person has different needs for a meal plan. Your health  care provider may recommend that you work with a dietitian to make a meal plan that is best for you. Your meal plan may vary depending on factors such as:  The calories you need.  The medicines you take.  Your weight.  Your blood glucose, blood pressure, and cholesterol levels.  Your activity level.  Other health conditions you have, such as heart or kidney disease. How do carbohydrates affect me? Carbohydrates, also called carbs, affect your blood glucose level more than any other type of food. Eating carbs naturally raises the amount of glucose in your blood. Carb counting is a method for keeping track of how many carbs you eat. Counting carbs is important to keep your blood glucose at a healthy level, especially if you use insulin or take certain oral diabetes medicines. It is important to know how many carbs you can safely have in each meal. This is different for every person. Your dietitian can help you calculate how many carbs you should have at each meal and for each snack. How does alcohol affect me? Alcohol can cause a sudden decrease in blood glucose (hypoglycemia), especially if you use insulin or  take certain oral diabetes medicines. Hypoglycemia can be a life-threatening condition. Symptoms of hypoglycemia, such as sleepiness, dizziness, and confusion, are similar to symptoms of having too much alcohol.  Do not drink alcohol if: ? Your health care provider tells you not to drink. ? You are pregnant, may be pregnant, or are planning to become pregnant.  If you drink alcohol: ? Do not drink on an empty stomach. ? Limit how much you use to:  0-1 drink a day for women.  0-2 drinks a day for men. ? Be aware of how much alcohol is in your drink. In the [place] one drink equals one 12 oz bottle of beer (355 mL), one 5 oz glass of wine (148 mL), or one 1 oz glass of hard liquor (44 mL). ? Keep yourself hydrated with water, diet soda, or unsweetened iced tea.  Keep in mind that regular soda, juice, and other mixers may contain a lot of sugar and must be counted as carbs. What are tips for following this plan? Reading food labels  Start by checking the serving size on the "Nutrition Facts" label of packaged foods and drinks. The amount of calories, carbs, fats, and other nutrients listed on the label is based on one serving of the item. Many items contain more than one serving per package.  Check the total grams (g) of carbs in one serving. You can calculate the number of servings of carbs in one serving by dividing the total carbs by 15. For example, if a food has 30 g of total carbs per serving, it would be equal to 2 servings of carbs.  Check the number of grams (g) of saturated fats and trans fats in one serving. Choose foods that have a low amount or none of these fats.  Check the number of milligrams (mg) of salt (sodium) in one serving. Most people should limit total sodium intake to less than 2,300 mg per day.  Always check the nutrition information of foods labeled as "low-fat" or "nonfat." These foods may be higher in added sugar or refined carbs and should be avoided.  Talk to  your dietitian to identify your daily goals for nutrients listed on the label. Shopping  Avoid buying canned, pre-made, or processed foods. These foods tend to be high in fat, sodium, and added sugar.  Shop around the outside edge of the grocery store. This is where you will most often find fresh fruits and vegetables, bulk grains, fresh meats, and fresh dairy. Cooking  Use low-heat cooking methods, such as baking, instead of high-heat cooking methods like deep frying.  Cook using healthy oils, such as olive, canola, or sunflower oil.  Avoid cooking with butter, cream, or high-fat meats. Meal planning  Eat meals and snacks regularly, preferably at the same times every day. Avoid going long periods of time without eating.  Eat foods that are high in fiber, such as fresh fruits, vegetables, beans, and whole grains. Talk with your dietitian about how many servings of carbs you can eat at each meal.  Eat 4-6 oz (112-168 g) of lean protein each day, such as lean meat, chicken, fish, eggs, or tofu. One ounce (oz) of lean protein is equal to: ? 1 oz (28 g) of meat, chicken, or fish. ? 1 egg. ?  cup (62 g) of tofu.  Eat some foods each day that contain healthy fats, such as avocado, nuts, seeds, and fish.   What foods should I eat? Fruits Berries. Apples. Oranges. Peaches. Apricots. Plums. Grapes. Mango. Papaya. Pomegranate. Kiwi. Cherries. Vegetables Lettuce. Spinach. Leafy greens, including kale, chard, collard greens, and mustard greens. Beets. Cauliflower. Cabbage. Broccoli. Carrots. Green beans. Tomatoes. Peppers. Onions. Cucumbers. Brussels sprouts. Grains Whole grains, such as whole-wheat or whole-grain bread, crackers, tortillas, cereal, and pasta. Unsweetened oatmeal. Quinoa. Brown or wild rice. Meats and other proteins Seafood. Poultry without skin. Lean cuts of poultry and beef. Tofu. Nuts. Seeds. Dairy Low-fat or fat-free dairy products such as milk, yogurt, and cheese. The  items listed above may not be a complete list of foods and beverages you can eat. Contact a dietitian for more information. What foods should I avoid? Fruits Fruits canned with syrup. Vegetables Canned vegetables. Frozen vegetables with butter or cream sauce. Grains Refined white flour and flour products such as bread, pasta, snack foods, and cereals. Avoid all processed foods. Meats and other proteins Fatty cuts of meat. Poultry with skin. Breaded or fried meats. Processed meat. Avoid saturated fats. Dairy Full-fat yogurt, cheese, or milk. Beverages Sweetened drinks, such as soda or iced tea. The items listed above may not be a complete list of foods and beverages you should avoid. Contact a dietitian for more information. Questions to ask a health care provider  Do I need to meet with a diabetes educator?  Do I need to meet with a dietitian?  What number can I call if I have questions?  When are the best times to check my blood glucose? Where to find more information:  American Diabetes Association: diabetes.org  Academy of Nutrition and Dietetics: www.eatright.[employer]org  National Institute of Diabetes and Digestive and Kidney Diseases: CarFlippers.tnwww.niddk.nih.gov  Association of Diabetes Care and Education Specialists: www.diabeteseducator.org Summary  It is important to have healthy eating habits because your blood sugar (glucose) levels are greatly affected by what you eat and drink.  A healthy meal plan will help you control your blood glucose and maintain a healthy lifestyle.  Your health care provider may recommend that you work with a dietitian to make a meal plan that is best for you.  Keep in mind that carbohydrates (carbs) and alcohol have immediate effects on your blood glucose levels. It is important to count carbs and to use alcohol carefully. This information is not intended to replace advice given to you by your health care provider. Make sure  you discuss any questions you  have with your health care provider. Document Revised: 09/16/2019 Document Reviewed: 09/16/2019 Elsevier Patient Education  2021 Elsevier Inc.   lantus instruction-  lantus 30 units at night, seeing numbers over 200 in AM.  Then given 40 units at night lantus. Check blood glucose 2 xper day and call office in 1 wk to given number or sooner if having high numbers over 250.

## 2020-12-22 NOTE — Progress Notes (Signed)
Patient ID: Corey [name], male    DOB: 11/25/1965, 55 y.o.   MRN: [id number]   Chief Complaint  Patient presents with  . Diabetes   Subjective:    HPI   Pt here for diabetic follow up. Pt states his diet plays a role in his elevated numbers. Pt not checking sugars on a regular basis. Pt taking Glipizide, Lantus and Metformin. Pt does see eye doctor.  No inc thirst or urination. No sores or ulcers. No burning in feet. Pt seen eye doctor 2021- no retinopathy.  Pt was given lantus 10 units at night on last visit.   Was having a1c at 10 on last visit and now at 41.  Had some issues at first taking lantus, due to not wanting to inject with a needle.  Pt stating  wife was giving it to himself.  Pt gave himself 30 units starting about 2 nights ago. Pt is eating off the diet.  Eating what wife cooking.  Pt was on invokana and stopped it due to not coming down on bg.   Pt stating he knows he didn't call when seeing high bg after starting lantus started on last visit.  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  Has not done one in a while.   Checked bg today fasting- was in 200s. Pt stating ate late last night.  Medical History Jermon has a past medical history of Essential hypertension, Hypertriglyceridemia, and Type 2 diabetes mellitus ([place]).   Outpatient Encounter Medications as of 12/22/2020  Medication Sig  . Alcohol Swabs (ALCOHOL PADS) 70 % PADS Use  As directed with lantus, nightly.  Marland Kitchen aspirin 81 MG EC tablet Take 1 tablet (81 mg total) by mouth daily.  Marland Kitchen atorvastatin (LIPITOR) 20 MG tablet Take 1 tablet by mouth once daily  . blood glucose meter kit and supplies KIT Dispense based on patient and insurance preference. Test blood sugar once daily. Type 2 diabetes. E11.9  . fenofibrate 160 MG tablet Take 1 tablet by mouth once daily  . glipiZIDE (GLUCOTROL) 5 MG tablet TAKE 1 TABLET BY MOUTH IN THE MORNING AND 2 TABLETS IN THE EVENING  . glucose blood (CONTOUR NEXT TEST) test  strip USE 1 STRIP TO CHECK GLUCOSE ONCE DAILY  . hydrochlorothiazide (HYDRODIURIL) 25 MG tablet Take 1 tablet (25 mg total) by mouth daily.  Marland Kitchen ibuprofen (ADVIL) 800 MG tablet TAKE 1 TABLET BY MOUTH EVERY 8 HOURS AS NEEDED WITH FOOD  . insulin glargine (LANTUS) 100 UNIT/ML injection Inject 0.1 mLs (10 Units total) into the skin at bedtime.  . Insulin Syringe-Needle U-100 (INSULIN SYRINGE .5CC/31GX5/16") 31G X 5/16" 0.5 ML MISC Use as directed nightly.  Marland Kitchen losartan (COZAAR) 100 MG tablet TAKE 1 TABLET BY MOUTH ONCE DAILY, NEED APPOINTMENT BEFORE MORE REFILLS.  . metFORMIN (GLUCOPHAGE) 500 MG tablet TAKE 2 TABLETS BY MOUTH ONCE DAILY IN THE MORNING, 1 TABLET AT NOON, AND  2 TABLETS  IN THE EVENING   No facility-administered encounter medications on file as of 12/22/2020.     Review of Systems  Constitutional: Negative for chills and fever.  HENT: Negative for congestion, rhinorrhea and sore throat.   Respiratory: Negative for cough, shortness of breath and wheezing.   Cardiovascular: Negative for chest pain and leg swelling.  Gastrointestinal: Negative for abdominal pain, diarrhea, nausea and vomiting.  Genitourinary: Negative for dysuria and frequency.  Skin: Negative for rash.  Neurological: Negative for dizziness, weakness and headaches.     Vitals  BP (!) 159/88   Pulse 81   Temp (!) 97.4 F (36.3 C)   Wt 206 lb 9.6 oz (93.7 kg)   SpO2 98%   BMI 32.36 kg/m   Objective:   Physical Exam Vitals and nursing note reviewed.  Constitutional:      General: He is not in acute distress.    Appearance: Normal appearance. He is not ill-appearing.  HENT:     Head: Normocephalic.  Eyes:     Extraocular Movements: Extraocular movements intact.     Conjunctiva/sclera: Conjunctivae normal.     Pupils: Pupils are equal, round, and reactive to light.  Cardiovascular:     Rate and Rhythm: Normal rate and regular rhythm.     Pulses: Normal pulses.     Heart sounds: Normal heart sounds. No  murmur heard.   Pulmonary:     Effort: Pulmonary effort is normal.     Breath sounds: Normal breath sounds. No wheezing, rhonchi or rales.  Musculoskeletal:        General: Normal range of motion.     Right lower leg: No edema.     Left lower leg: No edema.  Skin:    General: Skin is warm and dry.     Findings: No rash.  Neurological:     General: No focal deficit present.     Mental Status: He is alert and oriented to person, place, and time.     Cranial Nerves: No cranial nerve deficit.  Psychiatric:        Mood and Affect: Mood normal.        Behavior: Behavior normal.        Thought Content: Thought content normal.        Judgment: Judgment normal.      Assessment and Plan   1. Uncontrolled type 2 diabetes mellitus with hyperglycemia ([place]) - Ambulatory referral to Endocrinology  2. Noncompliance with medication regimen - Ambulatory referral to Endocrinology  3. Microalbuminuria due to type 2 diabetes mellitus ([place])  4. Mixed hyperlipidemia  5. Essential hypertension, benign   htn-uncontrolled.  Had lostartan already today.  Will give hctz 47m to help with bp.   Avoid salt and dec to less than 2,0056mper day.  Uncontrolled dm2- pt very non-compliant with meds. Pt needs to continue wit lantus and take 30 units qhs. Cont with metformin and glipizide.   Cont to check bg 2x per day and call with nubmers in the next week.  If seeing numbers over 200 to call sooner.  Pt advised to change diet and avoid carbs and eat more protein/veggies.  Avoid sugars, sodas, and sweets. Pt voiced understanding. Long discussion about risk vs benefit of staying on to of his diabetes and bp.  Referral placed for endocrinology.  HLD- triglycerides elevated at 216, due to dm being out of control.  Cont meds.  Dec carbs and inc in exercising. Cont with lipitor and fenofibrate.  microalbumin ratio- elevated at 67, last time 147.   Return in about 3 months (around 03/24/2021) for f/u dm2  .  Counseling time greater than 30 mins with counseling pt about medical conditions and medications, coordination of care, reviewing labs, and documentation.

## 2020-12-27 ENCOUNTER — Telehealth: Payer: Self-pay

## 2020-12-27 NOTE — Telephone Encounter (Signed)
FYI--Contacted this patient for an appt for Uncontrollable DM per his PCP, Dr Ladona Ridgel at [facility]-patient did not want to schedule at this time because he said that his numbers are looking better for his readings. I advised this patient that he was referred to follow up here for DM-patient said he would call me back on Thursday with a decision to schedule or not and that he would contact Dr Lubertha Basque office.

## 2020-12-29 ENCOUNTER — Telehealth: Payer: Self-pay | Admitting: *Deleted

## 2020-12-29 NOTE — Telephone Encounter (Signed)
3/9 left vm for patient to return call to r/s ct's and echo from 08/2020 and appt with Dr Vickey Sages

## 2021-01-18 ENCOUNTER — Other Ambulatory Visit: Payer: Self-pay | Admitting: Family Medicine

## 2021-01-20 NOTE — Telephone Encounter (Signed)
Lab Results  Component Value Date   HGBA1C 11.0 (H) 12/16/2020    Lab Results  Component Value Date   CREATININE 1.07 12/16/2020     Lab Results  Component Value Date   CHOL 115 12/16/2020   HDL 26 (L) 12/16/2020   LDLCALC 54 12/16/2020   TRIG 216 (H) 12/16/2020   CHOLHDL 4.4 12/16/2020     BP Readings from Last 3 Encounters:  12/22/20 (!) 159/88  05/14/20 (!) 138/80  04/08/20 (!) 154/88

## 2021-02-02 ENCOUNTER — Telehealth: Payer: Self-pay

## 2021-02-02 DIAGNOSIS — E782 Mixed hyperlipidemia: Secondary | ICD-10-CM

## 2021-02-02 DIAGNOSIS — E1165 Type 2 diabetes mellitus with hyperglycemia: Secondary | ICD-10-CM

## 2021-02-02 DIAGNOSIS — Z79899 Other long term (current) drug therapy: Secondary | ICD-10-CM

## 2021-02-02 DIAGNOSIS — I10 Essential (primary) hypertension: Secondary | ICD-10-CM

## 2021-02-02 NOTE — Telephone Encounter (Signed)
Pt made a diabetic Follow up on 05/25 and needs blood work ordered?  Pt call back [phone]

## 2021-02-02 NOTE — Telephone Encounter (Signed)
Last labs 12/16/20: CBC with diff, HgbA1c, CMET, Lipid, Microalbumin urine

## 2021-02-08 NOTE — Telephone Encounter (Signed)
Did not go to endocrinology. Numbers have been looking good. Pt and pt wife has changed diet. Informed patient to call and have appt set up with endo. Pt agreed. Pt wanted note put back to let provider know about diet change and numbers. Please advise. Thank you

## 2021-02-08 NOTE — Telephone Encounter (Signed)
Pls call pt, he was referred to endo for further follow up on his uncontrolled diabetes.  Pls see if he went. If not let him know needs to call and have appt, we are not able to continue to refill his medications without seeing endo.   Thx.   Dr. Ladona Ridgel

## 2021-02-14 NOTE — Telephone Encounter (Signed)
Pls order cbc, cmp, a1c.  And microalbumin if hasn't had one in past year.  Pls let pt know to get these fasting.  And if labs are still elevated over 10 for the a1c or not coming down then we will require endo appt.    Pls also have pt give me some numbers of his fasting glucose in am and if he is taking the lantus?   Thx.  Dr. Ladona Ridgel

## 2021-02-14 NOTE — Telephone Encounter (Signed)
Lab orders placed. Pt states that he is taking his Lantus at night. Will provide numbers for [place] through my chart.

## 2021-02-23 ENCOUNTER — Other Ambulatory Visit: Payer: Self-pay | Admitting: Family Medicine

## 2021-02-23 DIAGNOSIS — E1165 Type 2 diabetes mellitus with hyperglycemia: Secondary | ICD-10-CM

## 2021-02-24 ENCOUNTER — Other Ambulatory Visit: Payer: Self-pay | Admitting: Family Medicine

## 2021-02-24 DIAGNOSIS — E1165 Type 2 diabetes mellitus with hyperglycemia: Secondary | ICD-10-CM

## 2021-03-09 DIAGNOSIS — Z79899 Other long term (current) drug therapy: Secondary | ICD-10-CM | POA: Diagnosis not present

## 2021-03-09 DIAGNOSIS — E1165 Type 2 diabetes mellitus with hyperglycemia: Secondary | ICD-10-CM | POA: Diagnosis not present

## 2021-03-09 DIAGNOSIS — E782 Mixed hyperlipidemia: Secondary | ICD-10-CM | POA: Diagnosis not present

## 2021-03-09 DIAGNOSIS — I10 Essential (primary) hypertension: Secondary | ICD-10-CM | POA: Diagnosis not present

## 2021-03-12 LAB — CBC WITH DIFFERENTIAL/PLATELET

## 2021-03-12 LAB — HEMOGLOBIN A1C

## 2021-03-12 LAB — COMPREHENSIVE METABOLIC PANEL
ALT: 30 IU/L (ref 0–44)
AST: 22 IU/L (ref 0–40)
Albumin/Globulin Ratio: 2 (ref 1.2–2.2)
Albumin: 4.7 g/dL (ref 3.8–4.9)
Alkaline Phosphatase: 43 IU/L — ABNORMAL LOW (ref 44–121)
BUN/Creatinine Ratio: 14 (ref 9–20)
BUN: 13 mg/dL (ref 6–24)
Bilirubin Total: 0.5 mg/dL (ref 0.0–1.2)
CO2: 23 mmol/L (ref 20–29)
Calcium: 10 mg/dL (ref 8.7–10.2)
Chloride: 105 mmol/L (ref 96–106)
Creatinine, Ser: 0.91 mg/dL (ref 0.76–1.27)
Globulin, Total: 2.3 g/dL (ref 1.5–4.5)
Glucose: 167 mg/dL — ABNORMAL HIGH (ref 65–99)
Potassium: 4.8 mmol/L (ref 3.5–5.2)
Sodium: 143 mmol/L (ref 134–144)
Total Protein: 7 g/dL (ref 6.0–8.5)
eGFR: 98 mL/min/{1.73_m2} (ref >59)

## 2021-03-14 ENCOUNTER — Telehealth: Payer: Self-pay | Admitting: *Deleted

## 2021-03-14 DIAGNOSIS — Z79899 Other long term (current) drug therapy: Secondary | ICD-10-CM

## 2021-03-14 DIAGNOSIS — E1165 Type 2 diabetes mellitus with hyperglycemia: Secondary | ICD-10-CM

## 2021-03-14 NOTE — Telephone Encounter (Signed)
No new urine test needed.  Thx. Dr. Karie Schwalbe

## 2021-03-14 NOTE — Telephone Encounter (Signed)
Called lab to see why a1c was canceled. Was told a1c and cbc was canceled because they did not receive enough blood to run those two test. Called pt. He states he is off wed and thurs and can do bloodwork on wed and come in on thurs. Their was an opening on thurs with dr taylor. appt changed and pt will go to lab on wed morning for bw. He also wanted to know if he needed to do urine test again. States he usually has to give an urine sample when he goes but last time lab did not collect one. Last urine ACR done 2 months ago. Do you want to add that to a1c and cbc.

## 2021-03-14 NOTE — Telephone Encounter (Signed)
Labs put in and pt states he will go to lab tomorrow

## 2021-03-16 ENCOUNTER — Ambulatory Visit: Payer: BC Managed Care – PPO | Admitting: Family Medicine

## 2021-03-16 ENCOUNTER — Telehealth: Payer: Self-pay

## 2021-03-16 DIAGNOSIS — Z79899 Other long term (current) drug therapy: Secondary | ICD-10-CM | POA: Diagnosis not present

## 2021-03-16 DIAGNOSIS — E1165 Type 2 diabetes mellitus with hyperglycemia: Secondary | ICD-10-CM | POA: Diagnosis not present

## 2021-03-16 NOTE — Telephone Encounter (Signed)
Has a question about labwork. Thinks he is being tested "wrong" at [facility].

## 2021-03-16 NOTE — Telephone Encounter (Signed)
Called pt to clarify. He states he does not think they drew enough blood again because they only took one tube of blood. A1c and cbc was ordered and I let pt know that I thought that was correct since it was just those two test. We should have results when pt comes in tomorrow and will discuss with him if any problems.

## 2021-03-17 ENCOUNTER — Other Ambulatory Visit: Payer: Self-pay

## 2021-03-17 ENCOUNTER — Encounter: Payer: Self-pay | Admitting: Family Medicine

## 2021-03-17 ENCOUNTER — Ambulatory Visit: Payer: BC Managed Care – PPO | Admitting: Family Medicine

## 2021-03-17 VITALS — BP 151/87 | Temp 96.9°F | Ht 67.0 in | Wt 206.0 lb

## 2021-03-17 DIAGNOSIS — E1165 Type 2 diabetes mellitus with hyperglycemia: Secondary | ICD-10-CM

## 2021-03-17 DIAGNOSIS — I10 Essential (primary) hypertension: Secondary | ICD-10-CM

## 2021-03-17 DIAGNOSIS — E119 Type 2 diabetes mellitus without complications: Secondary | ICD-10-CM | POA: Diagnosis not present

## 2021-03-17 DIAGNOSIS — E782 Mixed hyperlipidemia: Secondary | ICD-10-CM | POA: Diagnosis not present

## 2021-03-17 LAB — CBC WITH DIFFERENTIAL/PLATELET
Basophils Absolute: 0 10*3/uL (ref 0.0–0.2)
Basos: 1 %
EOS (ABSOLUTE): 0.1 10*3/uL (ref 0.0–0.4)
Eos: 1 %
Hematocrit: 36.4 % — ABNORMAL LOW (ref 37.5–51.0)
Hemoglobin: 12 g/dL — ABNORMAL LOW (ref 13.0–17.7)
Immature Grans (Abs): 0 10*3/uL (ref 0.0–0.1)
Immature Granulocytes: 0 %
Lymphocytes Absolute: 1.6 10*3/uL (ref 0.7–3.1)
Lymphs: 40 %
MCH: 26.7 pg (ref 26.6–33.0)
MCHC: 33 g/dL (ref 31.5–35.7)
MCV: 81 fL (ref 79–97)
Monocytes Absolute: 0.4 10*3/uL (ref 0.1–0.9)
Monocytes: 10 %
Neutrophils Absolute: 1.9 10*3/uL (ref 1.4–7.0)
Neutrophils: 48 %
Platelets: 265 10*3/uL (ref 150–450)
RBC: 4.49 x10E6/uL (ref 4.14–5.80)
RDW: 13.6 % (ref 11.6–15.4)
WBC: 3.9 10*3/uL (ref 3.4–10.8)

## 2021-03-17 LAB — HEMOGLOBIN A1C
Est. average glucose Bld gHb Est-mCnc: 197 mg/dL
Hgb A1c MFr Bld: 8.5 % — ABNORMAL HIGH (ref 4.8–5.6)

## 2021-03-17 MED ORDER — METFORMIN HCL 500 MG PO TABS: ORAL_TABLET | ORAL | 2 refills | Status: DC

## 2021-03-17 MED ORDER — ATORVASTATIN CALCIUM 20 MG PO TABS: ORAL_TABLET | ORAL | 1 refills | Status: DC

## 2021-03-17 MED ORDER — INSULIN GLARGINE 100 UNITS/ML SOLOSTAR PEN: 30.0000 [IU] | PEN_INJECTOR | Freq: Every day | SUBCUTANEOUS | 2 refills | Status: DC

## 2021-03-17 MED ORDER — FENOFIBRATE 160 MG PO TABS: 160.0000 mg | ORAL_TABLET | Freq: Every day | ORAL | 1 refills | Status: DC

## 2021-03-17 MED ORDER — GLIPIZIDE 5 MG PO TABS: ORAL_TABLET | ORAL | 2 refills | Status: DC

## 2021-03-17 MED ORDER — NOVOFINE PEN NEEDLE 32G X 6 MM MISC: 1 refills | Status: DC

## 2021-03-17 NOTE — Progress Notes (Signed)
Changed the diet and testing more regularly. Then went to 30 units at night time.  In morning fasting- some times high in am. Seeing 190s in am,  Some times having to eat late after 8pm.  Some times seeing lower numbers in 120s.  Some times doing 20 units of lantus, then had a low at 68 in middle of night.

## 2021-03-17 NOTE — Progress Notes (Signed)
Patient ID: Corey [name], male    DOB: [birth year], 55 y.o.   MRN: [id number]   Chief Complaint  Patient presents with   Diabetes    Follow up , requests changing from lantus vial to pen needle , injects 30 units daily    Fall    X 3 weeks scraped R leg with metal    Subjective:    HPI  Dm2- Compliant with medications. Checking blood glucose.   Not seeing any high or low numbers.  Denies polyuria or polydipsia.  Eye exam: overdue Foot exam: no new concerns.  Last a1c 11, now at 8.5.   Changed the diet and testing more regularly. Then went to 30 units at night time.  In morning fasting- some times high in am. Seeing 190s in am,  Some times having to eat late after 8pm.  Some times seeing lower numbers in 120s.  Some times doing 20 units of lantus, then had a low at 68 in middle of night.  Scratch to the front of the rt shin, healing and scabbed over.  No swelling, pain or drainage.  Medical History Nevin has a past medical history of Essential hypertension, Hypertriglyceridemia, and Type 2 diabetes mellitus ([place]).   Outpatient Encounter Medications as of 03/17/2021  Medication Sig   Alcohol Swabs (ALCOHOL PADS) 70 % PADS Use  As directed with lantus, nightly.   aspirin 81 MG EC tablet Take 1 tablet (81 mg total) by mouth daily.   blood glucose meter kit and supplies KIT Dispense based on patient and insurance preference. Test blood sugar once daily. Type 2 diabetes. E11.9   glucose blood (CONTOUR NEXT TEST) test strip USE 1 STRIP TO CHECK GLUCOSE ONCE DAILY   hydrochlorothiazide (HYDRODIURIL) 25 MG tablet Take 1 tablet (25 mg total) by mouth daily.   Insulin Pen Needle (NOVOFINE PEN NEEDLE) 32G X 6 MM MISC Use as directed.   Insulin Syringe-Needle U-100 (INSULIN SYRINGE .5CC/31GX5/16") 31G X 5/16" 0.5 ML MISC Use as directed nightly.   losartan (COZAAR) 50 MG tablet Take 2 tablets (100 mg total) by mouth daily.   [DISCONTINUED] atorvastatin (LIPITOR) 20 MG tablet Take 1  tablet by mouth once daily   [DISCONTINUED] fenofibrate 160 MG tablet Take 1 tablet by mouth once daily   [DISCONTINUED] glipiZIDE (GLUCOTROL) 5 MG tablet TAKE 1 TABLET BY MOUTH IN THE MORNING AND 2 IN THE EVENING   [DISCONTINUED] ibuprofen (ADVIL) 800 MG tablet TAKE 1 TABLET BY MOUTH EVERY 8 HOURS AS NEEDED WITH FOOD   [DISCONTINUED] insulin glargine (LANTUS) 100 UNIT/ML injection Inject 0.1 mLs (10 Units total) into the skin at bedtime.   [DISCONTINUED] insulin glargine (LANTUS) 100 unit/mL SOPN Inject 30 Units into the skin daily.   [DISCONTINUED] metFORMIN (GLUCOPHAGE) 500 MG tablet TAKE 2 TABLETS BY MOUTH ONCE DAILY IN THE MORNING AND 1 AT NOON AND 2 IN THE EVENING   atorvastatin (LIPITOR) 20 MG tablet Take 1 tablet by mouth once daily   fenofibrate 160 MG tablet Take 1 tablet (160 mg total) by mouth daily.   glipiZIDE (GLUCOTROL) 5 MG tablet Take 1 tab p.o. in AM and then take 2 tab p.o. in evening with meals.   metFORMIN (GLUCOPHAGE) 500 MG tablet Take 2 tab p.o. am, then 1 tab at noon, then 2 tab p.o. pm with meals.   No facility-administered encounter medications on file as of 03/17/2021.     Review of Systems  Constitutional:  Negative for chills and fever.  HENT:  Negative for congestion, rhinorrhea and sore throat.   Respiratory:  Negative for cough, shortness of breath and wheezing.   Cardiovascular:  Negative for chest pain and leg swelling.  Gastrointestinal:  Negative for abdominal pain, diarrhea, nausea and vomiting.  Genitourinary:  Negative for dysuria and frequency.  Skin:  Negative for rash.  Neurological:  Negative for dizziness, weakness and headaches.    Vitals BP (!) 151/87   Temp (!) 96.9 F (36.1 C)   Ht '5\' 7"'  (1.702 m)   Wt 206 lb (93.4 kg)   SpO2 99%   BMI 32.26 kg/m   Objective:   Physical Exam Vitals and nursing note reviewed.  Constitutional:      General: He is not in acute distress.    Appearance: Normal appearance. He is not ill-appearing.   HENT:     Head: Normocephalic.     Nose: Nose normal. No congestion.     Mouth/Throat:     Mouth: Mucous membranes are moist.     Pharynx: No oropharyngeal exudate.  Eyes:     Extraocular Movements: Extraocular movements intact.     Conjunctiva/sclera: Conjunctivae normal.     Pupils: Pupils are equal, round, and reactive to light.  Cardiovascular:     Rate and Rhythm: Normal rate and regular rhythm.     Pulses: Normal pulses.     Heart sounds: Normal heart sounds. No murmur heard. Pulmonary:     Effort: Pulmonary effort is normal.     Breath sounds: Normal breath sounds. No wheezing, rhonchi or rales.  Musculoskeletal:        General: Normal range of motion.     Right lower leg: No edema.     Left lower leg: No edema.  Skin:    General: Skin is warm and dry.     Findings: No rash.  Neurological:     General: No focal deficit present.     Mental Status: He is alert and oriented to person, place, and time.     Cranial Nerves: No cranial nerve deficit.  Psychiatric:        Mood and Affect: Mood normal.        Behavior: Behavior normal.        Thought Content: Thought content normal.        Judgment: Judgment normal.     Assessment and Plan   1. Controlled type 2 diabetes mellitus without complication, without long-term current use of insulin (HCC) - metFORMIN (GLUCOPHAGE) 500 MG tablet; Take 2 tab p.o. am, then 1 tab at noon, then 2 tab p.o. pm with meals.  Dispense: 300 tablet; Refill: 2  2. Mixed hyperlipidemia - atorvastatin (LIPITOR) 20 MG tablet; Take 1 tablet by mouth once daily  Dispense: 90 tablet; Refill: 1  3. Essential hypertension, benign   DM2- marked improvement from a1c 11 to 8.5.  pt working on his diet and taking medications as prescribed.  A1c improved, but not at goal.  Pt to cont the good work.   Hld- stable. Cont meds.  Htn- suboptimal.  Cont to dec salt and inc in exercising.  Cont meds.  Return in about 3 months (around 06/17/2021) for f/u  dm2, htn, hld.   04/02/2021

## 2021-03-18 ENCOUNTER — Other Ambulatory Visit: Payer: Self-pay | Admitting: *Deleted

## 2021-03-18 MED ORDER — INSULIN GLARGINE 100 UNITS/ML SOLOSTAR PEN: 30.0000 [IU] | PEN_INJECTOR | Freq: Every day | SUBCUTANEOUS | 2 refills | Status: DC

## 2021-05-28 ENCOUNTER — Other Ambulatory Visit: Payer: Self-pay | Admitting: Family Medicine

## 2021-05-30 NOTE — Telephone Encounter (Signed)
Please contact patient to have him set up appt this month for recheck of diabetes. Thank you

## 2021-06-23 ENCOUNTER — Other Ambulatory Visit: Payer: Self-pay | Admitting: Family Medicine

## 2021-07-23 ENCOUNTER — Other Ambulatory Visit: Payer: Self-pay | Admitting: Family Medicine

## 2021-08-17 ENCOUNTER — Other Ambulatory Visit: Payer: Self-pay | Admitting: Family Medicine

## 2021-09-06 ENCOUNTER — Ambulatory Visit: Payer: BC Managed Care – PPO | Admitting: Family Medicine

## 2021-09-22 ENCOUNTER — Other Ambulatory Visit: Payer: Self-pay | Admitting: Family Medicine

## 2021-10-04 ENCOUNTER — Telehealth: Payer: Self-pay | Admitting: Family Medicine

## 2021-10-04 DIAGNOSIS — I10 Essential (primary) hypertension: Secondary | ICD-10-CM

## 2021-10-04 DIAGNOSIS — R809 Proteinuria, unspecified: Secondary | ICD-10-CM

## 2021-10-04 DIAGNOSIS — E782 Mixed hyperlipidemia: Secondary | ICD-10-CM

## 2021-10-04 DIAGNOSIS — Z79899 Other long term (current) drug therapy: Secondary | ICD-10-CM

## 2021-10-04 DIAGNOSIS — E1129 Type 2 diabetes mellitus with other diabetic kidney complication: Secondary | ICD-10-CM

## 2021-10-04 DIAGNOSIS — E119 Type 2 diabetes mellitus without complications: Secondary | ICD-10-CM

## 2021-10-04 NOTE — Telephone Encounter (Signed)
Patient needing labs for visit on 12/20 would like add urinalysis too.

## 2021-10-04 NOTE — Telephone Encounter (Signed)
CBC and A1C completed 03/16/21. Please advise. Thank you

## 2021-10-05 NOTE — Telephone Encounter (Signed)
Lab orders placed. Left message to return call  

## 2021-10-05 NOTE — Telephone Encounter (Signed)
Pt returned call and front informed pt that labs were ordered

## 2021-10-06 DIAGNOSIS — E1129 Type 2 diabetes mellitus with other diabetic kidney complication: Secondary | ICD-10-CM | POA: Diagnosis not present

## 2021-10-06 DIAGNOSIS — Z79899 Other long term (current) drug therapy: Secondary | ICD-10-CM | POA: Diagnosis not present

## 2021-10-06 DIAGNOSIS — E119 Type 2 diabetes mellitus without complications: Secondary | ICD-10-CM | POA: Diagnosis not present

## 2021-10-06 DIAGNOSIS — E782 Mixed hyperlipidemia: Secondary | ICD-10-CM | POA: Diagnosis not present

## 2021-10-06 DIAGNOSIS — I10 Essential (primary) hypertension: Secondary | ICD-10-CM | POA: Diagnosis not present

## 2021-10-07 ENCOUNTER — Encounter: Payer: Self-pay | Admitting: Family Medicine

## 2021-10-07 ENCOUNTER — Other Ambulatory Visit: Payer: Self-pay | Admitting: Family Medicine

## 2021-10-07 DIAGNOSIS — E1165 Type 2 diabetes mellitus with hyperglycemia: Secondary | ICD-10-CM | POA: Insufficient documentation

## 2021-10-07 DIAGNOSIS — E782 Mixed hyperlipidemia: Secondary | ICD-10-CM

## 2021-10-07 LAB — HEMOGLOBIN A1C
Est. average glucose Bld gHb Est-mCnc: 315 mg/dL
Hgb A1c MFr Bld: 12.6 % — ABNORMAL HIGH (ref 4.8–5.6)

## 2021-10-07 LAB — CMP14+EGFR
ALT: 30 IU/L (ref 0–44)
AST: 31 IU/L (ref 0–40)
Albumin/Globulin Ratio: 1.6 (ref 1.2–2.2)
Albumin: 4.6 g/dL (ref 3.8–4.9)
Alkaline Phosphatase: 38 IU/L — ABNORMAL LOW (ref 44–121)
BUN/Creatinine Ratio: 16 (ref 9–20)
BUN: 16 mg/dL (ref 6–24)
Bilirubin Total: 0.3 mg/dL (ref 0.0–1.2)
CO2: 20 mmol/L (ref 20–29)
Calcium: 10.1 mg/dL (ref 8.7–10.2)
Chloride: 102 mmol/L (ref 96–106)
Creatinine, Ser: 0.98 mg/dL (ref 0.76–1.27)
Globulin, Total: 2.8 g/dL (ref 1.5–4.5)
Glucose: 262 mg/dL — ABNORMAL HIGH (ref 70–99)
Potassium: 4.7 mmol/L (ref 3.5–5.2)
Sodium: 140 mmol/L (ref 134–144)
Total Protein: 7.4 g/dL (ref 6.0–8.5)
eGFR: 90 mL/min/{1.73_m2} (ref >59)

## 2021-10-07 LAB — URINALYSIS
Bilirubin, UA: NEGATIVE
Ketones, UA: NEGATIVE
Leukocytes,UA: NEGATIVE
Nitrite, UA: NEGATIVE
RBC, UA: NEGATIVE
Specific Gravity, UA: 1.015 (ref 1.005–1.030)
Urobilinogen, Ur: 0.2 mg/dL (ref 0.2–1.0)
pH, UA: 5.5 (ref 5.0–7.5)

## 2021-10-07 LAB — LIPID PANEL
Chol/HDL Ratio: 7 ratio — ABNORMAL HIGH (ref 0.0–5.0)
Cholesterol, Total: 147 mg/dL (ref 100–199)
HDL: 21 mg/dL — ABNORMAL LOW (ref >39)
LDL Chol Calc (NIH): 42 mg/dL (ref 0–99)
Triglycerides: 588 mg/dL (ref 0–149)
VLDL Cholesterol Cal: 84 mg/dL — ABNORMAL HIGH (ref 5–40)

## 2021-10-07 LAB — CBC WITH DIFFERENTIAL/PLATELET
Basophils Absolute: 0 10*3/uL (ref 0.0–0.2)
Basos: 1 %
EOS (ABSOLUTE): 0.1 10*3/uL (ref 0.0–0.4)
Eos: 1 %
Hematocrit: 39.1 % (ref 37.5–51.0)
Hemoglobin: 12.8 g/dL — ABNORMAL LOW (ref 13.0–17.7)
Immature Grans (Abs): 0 10*3/uL (ref 0.0–0.1)
Immature Granulocytes: 0 %
Lymphocytes Absolute: 1.7 10*3/uL (ref 0.7–3.1)
Lymphs: 44 %
MCH: 26.9 pg (ref 26.6–33.0)
MCHC: 32.7 g/dL (ref 31.5–35.7)
MCV: 82 fL (ref 79–97)
Monocytes Absolute: 0.4 10*3/uL (ref 0.1–0.9)
Monocytes: 10 %
Neutrophils Absolute: 1.7 10*3/uL (ref 1.4–7.0)
Neutrophils: 44 %
Platelets: 233 10*3/uL (ref 150–450)
RBC: 4.76 x10E6/uL (ref 4.14–5.80)
RDW: 13.5 % (ref 11.6–15.4)
WBC: 3.8 10*3/uL (ref 3.4–10.8)

## 2021-10-11 ENCOUNTER — Encounter: Payer: Self-pay | Admitting: Family Medicine

## 2021-10-11 ENCOUNTER — Other Ambulatory Visit: Payer: Self-pay

## 2021-10-11 ENCOUNTER — Ambulatory Visit: Payer: BC Managed Care – PPO | Admitting: Family Medicine

## 2021-10-11 VITALS — BP 145/82 | HR 112 | Temp 99.3°F | Ht 67.0 in | Wt 207.0 lb

## 2021-10-11 DIAGNOSIS — E1165 Type 2 diabetes mellitus with hyperglycemia: Secondary | ICD-10-CM

## 2021-10-11 DIAGNOSIS — I10 Essential (primary) hypertension: Secondary | ICD-10-CM | POA: Diagnosis not present

## 2021-10-11 DIAGNOSIS — I7121 Aneurysm of the ascending aorta, without rupture: Secondary | ICD-10-CM | POA: Insufficient documentation

## 2021-10-11 DIAGNOSIS — Z9114 Patient's other noncompliance with medication regimen: Secondary | ICD-10-CM

## 2021-10-11 DIAGNOSIS — I2510 Atherosclerotic heart disease of native coronary artery without angina pectoris: Secondary | ICD-10-CM | POA: Insufficient documentation

## 2021-10-11 DIAGNOSIS — E782 Mixed hyperlipidemia: Secondary | ICD-10-CM | POA: Diagnosis not present

## 2021-10-11 DIAGNOSIS — Z91148 Patient's other noncompliance with medication regimen for other reason: Secondary | ICD-10-CM

## 2021-10-11 NOTE — Assessment & Plan Note (Signed)
I advised the patient that he needs to be compliant with his medication/therapy and we need better control of his type 2 diabetes to prevent complications.  Discussed endocrinology referral.

## 2021-10-11 NOTE — Patient Instructions (Signed)
Increase the Semglee to 40 units.  Check fasting blood sugar every morning. Increase insulin by 2 units daily until fasting blood sugar is below 150.  Continue Metformin and Glipizide.  Follow up in 2 weeks.  Take care  Dr. Adriana Simas

## 2021-10-11 NOTE — Progress Notes (Signed)
Subjective:  Patient ID: Corey [name], male    DOB: November 02, 1965  Age: 55 y.o. MRN: [id number]  CC: Chief Complaint  Patient presents with   Diabetes   Hypertension   Hyperlipidemia    HPI:  55 year old male with a past medical history of uncontrolled type 2 diabetes, coronary artery disease, ascending aortic aneurysm, hypertension, hyperlipidemia, and noncompliance presents for follow-up.  Patient's diabetes is markedly uncontrolled.  A1c on 12/15 = 12.6.  Patient is noncompliant.  Patient states that he does not take his medication as he should and also has dietary indiscretion.  His current medications for his diabetes are glipizide, metformin, and Semglee.  No reports of hypoglycemia.  He does not check his sugars on a consistent regular basis.  Per review of his prior A1c's, patient has never really had good longstanding control of his diabetes.  Patient's hypertension is fairly well controlled.  BP 145/82 today.  Would prefer better control.  Triglycerides uncontrolled, this is likely due to his uncontrolled diabetes.  He is currently prescribed atorvastatin and fenofibrate.  Patient Active Problem List   Diagnosis Date Noted   CAD (coronary artery disease) 10/11/2021   Ascending aortic aneurysm 10/11/2021   Uncontrolled type 2 diabetes mellitus with hyperglycemia (HCC) 10/07/2021   Noncompliance with medication regimen 05/20/2020   Mixed hyperlipidemia 02/09/2013   Essential hypertension, benign 02/09/2013    Social Hx   Social History   Socioeconomic History   Marital status: Married    Spouse name: Not on file   Number of children: Not on file   Years of education: Not on file   Highest education level: Not on file  Occupational History   Not on file  Tobacco Use   Smoking status: Former    Packs/day: 0.50    Years: 10.00    Pack years: 5.00    Types: Cigarettes    Quit date: 02/07/1993    Years since quitting: 28.6   Smokeless tobacco: Never  Substance and  Sexual Activity   Alcohol use: Yes    Comment: occasionally   Drug use: No   Sexual activity: Yes  Other Topics Concern   Not on file  Social History Narrative   Not on file   Social Determinants of Health   Financial Resource Strain: Not on file  Food Insecurity: Not on file  Transportation Needs: Not on file  Physical Activity: Not on file  Stress: Not on file  Social Connections: Not on file    Review of Systems  Constitutional: Negative.   Endocrine:       No reported hypoglycemia.    Objective:  BP (!) 145/82    Pulse (!) 112    Temp 99.3 F (37.4 C)    Ht 5\' 7"  (1.702 m)    Wt 207 lb (93.9 kg)    SpO2 100%    BMI 32.42 kg/m   BP/Weight 10/11/2021 03/17/2021 12/22/2020  Systolic BP 145 151 159  Diastolic BP 82 87 88  Wt. (Lbs) 207 206 206.6  BMI 32.42 32.26 32.36    Physical Exam Vitals and nursing note reviewed.  Constitutional:      General: He is not in acute distress.    Appearance: Normal appearance. He is not ill-appearing.  HENT:     Head: Normocephalic and atraumatic.  Cardiovascular:     Rate and Rhythm: Normal rate and regular rhythm.  Pulmonary:     Effort: Pulmonary effort is normal.     Breath sounds:  Normal breath sounds. No wheezing, rhonchi or rales.  Neurological:     Mental Status: He is alert.  Psychiatric:        Mood and Affect: Mood normal.        Behavior: Behavior normal.    Lab Results  Component Value Date   WBC 3.8 10/06/2021   HGB 12.8 (L) 10/06/2021   HCT 39.1 10/06/2021   PLT 233 10/06/2021   GLUCOSE 262 (H) 10/06/2021   CHOL 147 10/06/2021   TRIG 588 (HH) 10/06/2021   HDL 21 (L) 10/06/2021   LDLCALC 42 10/06/2021   ALT 30 10/06/2021   AST 31 10/06/2021   NA 140 10/06/2021   K 4.7 10/06/2021   CL 102 10/06/2021   CREATININE 0.98 10/06/2021   BUN 16 10/06/2021   CO2 20 10/06/2021   TSH 1.239 10/05/2014   PSA 0.58 03/02/2014   INR 1.04 07/12/2017   HGBA1C 12.6 (H) 10/06/2021   MICROALBUR 10.6 (H) 10/05/2014      Assessment & Plan:   Problem List Items Addressed This Visit       Cardiovascular and Mediastinum   Essential hypertension, benign    BP slightly elevated today.  We will continue to monitor.  Continue current medications.        Endocrine   Uncontrolled type 2 diabetes mellitus with hyperglycemia (HCC) - Primary    We had a lengthy discussion today about to being compliant with his medications.  We discussed complications associated with persistent hypoglycemia. Increasing insulin to 40 units nightly.  Patient will increase 1 to 2 units daily until fasting blood sugars are consistently below 150. Continue metformin and glipizide.  Will likely discontinue glipizide in the future and add SGLT2. Recommended to make it better dietary choices and focus on protein before eating a lot of carbohydrates.        Other   Mixed hyperlipidemia    Uncontrolled.  This is secondary to uncontrolled type 2 diabetes.  Continue Lipitor and fenofibrate.  Needs better glucose control.      Noncompliance with medication regimen    I advised the patient that he needs to be compliant with his medication/therapy and we need better control of his type 2 diabetes to prevent complications.  Discussed endocrinology referral.         Follow-up:  Return in about 2 weeks (around 10/25/2021).  Everlene Other DO [facility]

## 2021-10-11 NOTE — Assessment & Plan Note (Signed)
BP slightly elevated today.  We will continue to monitor.  Continue current medications.

## 2021-10-11 NOTE — Assessment & Plan Note (Signed)
We had a lengthy discussion today about to being compliant with his medications.  We discussed complications associated with persistent hypoglycemia. Increasing insulin to 40 units nightly.  Patient will increase 1 to 2 units daily until fasting blood sugars are consistently below 150. Continue metformin and glipizide.  Will likely discontinue glipizide in the future and add SGLT2. Recommended to make it better dietary choices and focus on protein before eating a lot of carbohydrates.

## 2021-10-11 NOTE — Assessment & Plan Note (Signed)
Uncontrolled.  This is secondary to uncontrolled type 2 diabetes.  Continue Lipitor and fenofibrate.  Needs better glucose control.

## 2021-10-25 ENCOUNTER — Other Ambulatory Visit: Payer: Self-pay

## 2021-10-25 ENCOUNTER — Ambulatory Visit: Payer: BC Managed Care – PPO | Admitting: Family Medicine

## 2021-10-25 ENCOUNTER — Encounter: Payer: Self-pay | Admitting: Family Medicine

## 2021-10-25 DIAGNOSIS — E1165 Type 2 diabetes mellitus with hyperglycemia: Secondary | ICD-10-CM | POA: Diagnosis not present

## 2021-10-25 MED ORDER — SEMGLEE (YFGN) 100 UNIT/ML ~~LOC~~ SOPN: 30.0000 [IU] | PEN_INJECTOR | Freq: Two times a day (BID) | SUBCUTANEOUS | 0 refills | Status: DC

## 2021-10-25 NOTE — Patient Instructions (Signed)
Increase to 30 units twice daily.  Follow up in 2 weeks.  Please bring in a log of your blood sugar readings.  Consider seeing Endocrinology.

## 2021-10-25 NOTE — Assessment & Plan Note (Signed)
Blood sugars remain uncontrolled.  Patient is up to 50 units of insulin nightly.  Fasting this morning was 211. Increasing insulin to 30 units twice daily.  Discussed endocrinology referral.  Patient would like to proceed with increase in insulin and try some dietary changes.  Follow-up in 2 weeks.  If blood sugar remains uncontrolled will refer to endocrinology.

## 2021-10-25 NOTE — Progress Notes (Signed)
Subjective:  Patient ID: Corey [name], male    DOB: 1966/07/13  Age: 56 y.o. MRN: [id number]  CC: Chief Complaint  Patient presents with   Diabetes    Pt following up on Dm. Checking sugars daily    HPI:  56 year old male presents for follow-up regarding his diabetes.  Patient endorses current compliance with medication.  He is currently on 50 units of Semglee nightly.  He has increased his dose per my previous recommendations.  Fastings continue to be uncontrolled.  Blood sugar 211 this morning.  Patient endorses compliance with his medications currently.  Patient Active Problem List   Diagnosis Date Noted   CAD (coronary artery disease) 10/11/2021   Ascending aortic aneurysm 10/11/2021   Uncontrolled type 2 diabetes mellitus with hyperglycemia ([place]) 10/07/2021   Noncompliance with medication regimen 05/20/2020   Mixed hyperlipidemia 02/09/2013   Essential hypertension, benign 02/09/2013    Social Hx   Social History   Socioeconomic History   Marital status: Married    Spouse name: Not on file   Number of children: Not on file   Years of education: Not on file   Highest education level: Not on file  Occupational History   Not on file  Tobacco Use   Smoking status: Former    Packs/day: 0.50    Years: 10.00    Pack years: 5.00    Types: Cigarettes    Quit date: 02/07/1993    Years since quitting: 28.7   Smokeless tobacco: Never  Substance and Sexual Activity   Alcohol use: Yes    Comment: occasionally   Drug use: No   Sexual activity: Yes  Other Topics Concern   Not on file  Social History Narrative   Not on file   Social Determinants of Health   Financial Resource Strain: Not on file  Food Insecurity: Not on file  Transportation Needs: Not on file  Physical Activity: Not on file  Stress: Not on file  Social Connections: Not on file    Review of Systems Per HPI  Objective:  BP 140/80    Pulse (!) 102    Temp 98.6 F (37 C)    Ht 5\' 7"  (1.702 m)     Wt 208 lb 6.4 oz (94.5 kg)    SpO2 96%    BMI 32.64 kg/m   BP/Weight 10/25/2021 10/11/2021 0000000  Systolic BP [id number]  Diastolic BP 80 82 87  Wt. (Lbs) 208.4 207 206  BMI 32.64 32.42 32.26    Physical Exam Vitals and nursing note reviewed.  Constitutional:      General: He is not in acute distress.    Appearance: Normal appearance.  HENT:     Head: Normocephalic and atraumatic.  Cardiovascular:     Rate and Rhythm: Normal rate and regular rhythm.  Pulmonary:     Effort: Pulmonary effort is normal.     Breath sounds: Normal breath sounds. No wheezing or rales.  Neurological:     Mental Status: He is alert.  Psychiatric:        Mood and Affect: Mood normal.        Behavior: Behavior normal.    Lab Results  Component Value Date   WBC 3.8 10/06/2021   HGB 12.8 (L) 10/06/2021   HCT 39.1 10/06/2021   PLT 233 10/06/2021   GLUCOSE 262 (H) 10/06/2021   CHOL 147 10/06/2021   TRIG 588 (HH) 10/06/2021   HDL 21 (L) 10/06/2021   LDLCALC 42  10/06/2021   ALT 30 10/06/2021   AST 31 10/06/2021   NA 140 10/06/2021   K 4.7 10/06/2021   CL 102 10/06/2021   CREATININE 0.98 10/06/2021   BUN 16 10/06/2021   CO2 20 10/06/2021   TSH 1.239 10/05/2014   PSA 0.58 03/02/2014   INR 1.04 07/12/2017   HGBA1C 12.6 (H) 10/06/2021   MICROALBUR 10.6 (H) 10/05/2014     Assessment & Plan:   Problem List Items Addressed This Visit       Endocrine   Uncontrolled type 2 diabetes mellitus with hyperglycemia ([place])    Blood sugars remain uncontrolled.  Patient is up to 50 units of insulin nightly.  Fasting this morning was 211. Increasing insulin to 30 units twice daily.  Discussed endocrinology referral.  Patient would like to proceed with increase in insulin and try some dietary changes.  Follow-up in 2 weeks.  If blood sugar remains uncontrolled will refer to endocrinology.      Relevant Medications   SEMGLEE, YFGN, 100 UNIT/ML Pen    Meds ordered this encounter  Medications    SEMGLEE, YFGN, 100 UNIT/ML Pen    Sig: Inject 30 Units into the skin 2 (two) times daily.    Dispense:  15 mL    Refill:  0    Follow-up:  Return in about 2 weeks (around 11/08/2021) for Diabetes follow up.  Corey [name]

## 2021-10-31 ENCOUNTER — Other Ambulatory Visit: Payer: Self-pay | Admitting: Family Medicine

## 2021-11-02 ENCOUNTER — Other Ambulatory Visit: Payer: Self-pay | Admitting: Family Medicine

## 2021-11-03 ENCOUNTER — Other Ambulatory Visit: Payer: Self-pay | Admitting: Family Medicine

## 2021-11-03 ENCOUNTER — Other Ambulatory Visit: Payer: Self-pay

## 2021-11-03 MED ORDER — SEMGLEE (YFGN) 100 UNIT/ML ~~LOC~~ SOPN: 30.0000 [IU] | PEN_INJECTOR | Freq: Two times a day (BID) | SUBCUTANEOUS | 0 refills | Status: DC

## 2021-11-08 ENCOUNTER — Other Ambulatory Visit: Payer: Self-pay

## 2021-11-08 ENCOUNTER — Ambulatory Visit: Payer: BC Managed Care – PPO | Admitting: Family Medicine

## 2021-11-08 DIAGNOSIS — I10 Essential (primary) hypertension: Secondary | ICD-10-CM | POA: Diagnosis not present

## 2021-11-08 DIAGNOSIS — K760 Fatty (change of) liver, not elsewhere classified: Secondary | ICD-10-CM | POA: Diagnosis not present

## 2021-11-08 DIAGNOSIS — E1165 Type 2 diabetes mellitus with hyperglycemia: Secondary | ICD-10-CM | POA: Diagnosis not present

## 2021-11-08 NOTE — Assessment & Plan Note (Signed)
Patient has had significant improvement and glycemic control.  Continue current dosing of insulin as well as metformin and glipizide.  Follow-up in 2 months.

## 2021-11-08 NOTE — Assessment & Plan Note (Signed)
BP mildly elevated today.  Continue current pharmacotherapy with HCTZ and losartan.  We will revisit in 2 months.

## 2021-11-08 NOTE — Progress Notes (Signed)
Subjective:  Patient ID: Corey [name], male    DOB: June 10, 1966  Age: 56 y.o. MRN: [id number]  CC: Chief Complaint  Patient presents with   Follow-up    diabetes    HPI:  56 year old male with CAD, AAA, uncontrolled type 2 diabetes, hyperlipidemia, hypertension presents for follow-up regarding his diabetes.  Patient brings in his blood sugar readings today.  He had 1 episode of hypoglycemia, 66.  The following day his sugars were elevated in the 170s.  He states that he thinks he was overzealous with treatment of hypoglycemia.  The remainder of his blood glucose readings are below 140.  He states that his fasting blood sugar this morning was 142.  He has made some dietary changes and is now taking his medications.  He is on Semglee 30 units twice daily, glipizide, and metformin.  Patient's blood pressure elevated today and on repeat.  He is on losartan and HCTZ.  May need increase in losartan dose.  Patient Active Problem List   Diagnosis Date Noted   Hepatic steatosis 11/08/2021   CAD (coronary artery disease) 10/11/2021   Ascending aortic aneurysm 10/11/2021   Uncontrolled type 2 diabetes mellitus with hyperglycemia (HCC) 10/07/2021   Noncompliance with medication regimen 05/20/2020   Mixed hyperlipidemia 02/09/2013   Essential hypertension, benign 02/09/2013    Social Hx   Social History   Socioeconomic History   Marital status: Married    Spouse name: Not on file   Number of children: Not on file   Years of education: Not on file   Highest education level: Not on file  Occupational History   Not on file  Tobacco Use   Smoking status: Former    Packs/day: 0.50    Years: 10.00    Pack years: 5.00    Types: Cigarettes    Quit date: 02/07/1993    Years since quitting: 28.7   Smokeless tobacco: Never  Substance and Sexual Activity   Alcohol use: Yes    Comment: occasionally   Drug use: No   Sexual activity: Yes  Other Topics Concern   Not on file  Social History  Narrative   Not on file   Social Determinants of Health   Financial Resource Strain: Not on file  Food Insecurity: Not on file  Transportation Needs: Not on file  Physical Activity: Not on file  Stress: Not on file  Social Connections: Not on file    Review of Systems  Constitutional: Negative.   Endocrine:       Recent hypoglycemia.    Objective:  BP (!) 150/84    Pulse 62    Temp 98.7 F (37.1 C) (Oral)    Ht 5\' 7"  (1.702 m)    Wt 209 lb 3.2 oz (94.9 kg)    SpO2 97%    BMI 32.77 kg/m   BP/Weight 11/08/2021 10/25/2021 10/11/2021  Systolic BP 150 140 145  Diastolic BP 84 80 82  Wt. (Lbs) 209.2 208.4 207  BMI 32.77 32.64 32.42    Physical Exam Vitals and nursing note reviewed.  Constitutional:      General: He is not in acute distress.    Appearance: Normal appearance.  HENT:     Head: Normocephalic and atraumatic.  Cardiovascular:     Rate and Rhythm: Normal rate and regular rhythm.     Heart sounds: No murmur heard. Pulmonary:     Effort: Pulmonary effort is normal.     Breath sounds: Normal breath sounds. No  wheezing, rhonchi or rales.  Neurological:     Mental Status: He is alert.  Psychiatric:        Mood and Affect: Mood normal.        Behavior: Behavior normal.    Lab Results  Component Value Date   WBC 3.8 10/06/2021   HGB 12.8 (L) 10/06/2021   HCT 39.1 10/06/2021   PLT 233 10/06/2021   GLUCOSE 262 (H) 10/06/2021   CHOL 147 10/06/2021   TRIG 588 (HH) 10/06/2021   HDL 21 (L) 10/06/2021   LDLCALC 42 10/06/2021   ALT 30 10/06/2021   AST 31 10/06/2021   NA 140 10/06/2021   K 4.7 10/06/2021   CL 102 10/06/2021   CREATININE 0.98 10/06/2021   BUN 16 10/06/2021   CO2 20 10/06/2021   TSH 1.239 10/05/2014   PSA 0.58 03/02/2014   INR 1.04 07/12/2017   HGBA1C 12.6 (H) 10/06/2021   MICROALBUR 10.6 (H) 10/05/2014     Assessment & Plan:   Problem List Items Addressed This Visit       Cardiovascular and Mediastinum   Essential hypertension,  benign    BP mildly elevated today.  Continue current pharmacotherapy with HCTZ and losartan.  We will revisit in 2 months.        Digestive   Hepatic steatosis     Endocrine   Uncontrolled type 2 diabetes mellitus with hyperglycemia (HCC)    Patient has had significant improvement and glycemic control.  Continue current dosing of insulin as well as metformin and glipizide.  Follow-up in 2 months.      Follow-up:  Return in about 2 months (around 01/06/2022).  Everlene Other DO [facility]

## 2021-11-08 NOTE — Patient Instructions (Addendum)
Continue your current medication at current dosing.   Follow up in 2 months.  Take care  Dr. Adriana Simas

## 2021-12-07 ENCOUNTER — Other Ambulatory Visit: Payer: Self-pay | Admitting: Family Medicine

## 2021-12-19 ENCOUNTER — Other Ambulatory Visit: Payer: Self-pay | Admitting: Family Medicine

## 2022-01-08 ENCOUNTER — Other Ambulatory Visit: Payer: Self-pay | Admitting: Family Medicine

## 2022-01-26 ENCOUNTER — Telehealth: Payer: Self-pay | Admitting: Family Medicine

## 2022-01-26 NOTE — Telephone Encounter (Signed)
Patient's last labs were drawn on 10/06/21  (CBC,CMP14+EGFR, Lipid, A1C and urinalysis). Please advise. Thank you ?

## 2022-01-26 NOTE — Telephone Encounter (Signed)
Patient has physical on 4/12 and needing labs done. He would like to get done today if impossible because he is off work. ?

## 2022-01-27 ENCOUNTER — Other Ambulatory Visit: Payer: Self-pay | Admitting: Family Medicine

## 2022-01-30 ENCOUNTER — Other Ambulatory Visit: Payer: Self-pay | Admitting: *Deleted

## 2022-01-30 DIAGNOSIS — E782 Mixed hyperlipidemia: Secondary | ICD-10-CM

## 2022-01-30 DIAGNOSIS — E119 Type 2 diabetes mellitus without complications: Secondary | ICD-10-CM

## 2022-01-30 DIAGNOSIS — I10 Essential (primary) hypertension: Secondary | ICD-10-CM

## 2022-01-30 DIAGNOSIS — Z79899 Other long term (current) drug therapy: Secondary | ICD-10-CM

## 2022-01-30 NOTE — Telephone Encounter (Signed)
Patient informed labs ordered for upcoming visit. Verbalized understanding. ?

## 2022-01-30 NOTE — Telephone Encounter (Signed)
LMTRC

## 2022-01-30 NOTE — Telephone Encounter (Signed)
Labs ordered in epic

## 2022-02-01 ENCOUNTER — Other Ambulatory Visit: Payer: Self-pay | Admitting: Family Medicine

## 2022-02-01 ENCOUNTER — Ambulatory Visit: Payer: Managed Care, Other (non HMO) | Admitting: Family Medicine

## 2022-02-01 DIAGNOSIS — E782 Mixed hyperlipidemia: Secondary | ICD-10-CM

## 2022-02-10 LAB — CMP14+EGFR
ALT: 30 IU/L (ref 0–44)
AST: 26 IU/L (ref 0–40)
Albumin/Globulin Ratio: 1.7 (ref 1.2–2.2)
Albumin: 4.6 g/dL (ref 3.8–4.9)
Alkaline Phosphatase: 33 IU/L — ABNORMAL LOW (ref 44–121)
BUN/Creatinine Ratio: 15 (ref 9–20)
BUN: 15 mg/dL (ref 6–24)
Bilirubin Total: 0.3 mg/dL (ref 0.0–1.2)
CO2: 20 mmol/L (ref 20–29)
Calcium: 10.1 mg/dL (ref 8.7–10.2)
Chloride: 103 mmol/L (ref 96–106)
Creatinine, Ser: 1.01 mg/dL (ref 0.76–1.27)
Globulin, Total: 2.7 g/dL (ref 1.5–4.5)
Glucose: 210 mg/dL — ABNORMAL HIGH (ref 70–99)
Potassium: 4.3 mmol/L (ref 3.5–5.2)
Sodium: 140 mmol/L (ref 134–144)
Total Protein: 7.3 g/dL (ref 6.0–8.5)
eGFR: 86 mL/min/{1.73_m2} (ref >59)

## 2022-02-10 LAB — CBC WITH DIFFERENTIAL/PLATELET
Basophils Absolute: 0 10*3/uL (ref 0.0–0.2)
Basos: 1 %
EOS (ABSOLUTE): 0.1 10*3/uL (ref 0.0–0.4)
Eos: 2 %
Hematocrit: 37.1 % — ABNORMAL LOW (ref 37.5–51.0)
Hemoglobin: 12.4 g/dL — ABNORMAL LOW (ref 13.0–17.7)
Immature Grans (Abs): 0 10*3/uL (ref 0.0–0.1)
Immature Granulocytes: 0 %
Lymphocytes Absolute: 1.3 10*3/uL (ref 0.7–3.1)
Lymphs: 41 %
MCH: 27.1 pg (ref 26.6–33.0)
MCHC: 33.4 g/dL (ref 31.5–35.7)
MCV: 81 fL (ref 79–97)
Monocytes Absolute: 0.3 10*3/uL (ref 0.1–0.9)
Monocytes: 11 %
Neutrophils Absolute: 1.4 10*3/uL (ref 1.4–7.0)
Neutrophils: 45 %
Platelets: 248 10*3/uL (ref 150–450)
RBC: 4.58 x10E6/uL (ref 4.14–5.80)
RDW: 14.1 % (ref 11.6–15.4)
WBC: 3.1 10*3/uL — ABNORMAL LOW (ref 3.4–10.8)

## 2022-02-10 LAB — LIPID PANEL
Chol/HDL Ratio: 7.6 ratio — ABNORMAL HIGH (ref 0.0–5.0)
Cholesterol, Total: 144 mg/dL (ref 100–199)
HDL: 19 mg/dL — ABNORMAL LOW (ref >39)
LDL Chol Calc (NIH): 52 mg/dL (ref 0–99)
Triglycerides: 493 mg/dL — ABNORMAL HIGH (ref 0–149)
VLDL Cholesterol Cal: 73 mg/dL — ABNORMAL HIGH (ref 5–40)

## 2022-02-10 LAB — HEMOGLOBIN A1C
Est. average glucose Bld gHb Est-mCnc: 240 mg/dL
Hgb A1c MFr Bld: 10 % — ABNORMAL HIGH (ref 4.8–5.6)

## 2022-02-14 ENCOUNTER — Ambulatory Visit: Payer: Managed Care, Other (non HMO) | Admitting: Family Medicine

## 2022-02-14 VITALS — BP 140/78 | HR 88 | Temp 97.2°F | Ht 67.0 in | Wt 209.0 lb

## 2022-02-14 DIAGNOSIS — I7121 Aneurysm of the ascending aorta, without rupture: Secondary | ICD-10-CM | POA: Diagnosis not present

## 2022-02-14 DIAGNOSIS — I10 Essential (primary) hypertension: Secondary | ICD-10-CM | POA: Diagnosis not present

## 2022-02-14 DIAGNOSIS — E782 Mixed hyperlipidemia: Secondary | ICD-10-CM

## 2022-02-14 DIAGNOSIS — E1165 Type 2 diabetes mellitus with hyperglycemia: Secondary | ICD-10-CM | POA: Diagnosis not present

## 2022-02-14 NOTE — Assessment & Plan Note (Signed)
CT ordered. 

## 2022-02-14 NOTE — Patient Instructions (Signed)
Continue your medications. ? ?I have placed the referral to endo and placed order for CT scan. Someone will be in touch to schedule. ? ?Follow up in 6 months. ? ?Take care ? ?Dr. Adriana Simas  ?

## 2022-02-14 NOTE — Assessment & Plan Note (Signed)
Remains uncontrolled.  Continue current medications.  Referring to endocrinology. ?

## 2022-02-14 NOTE — Assessment & Plan Note (Signed)
Needs better control of diabetes to aid in triglycerides.  Likely needs additional dietary changes.  Continue fenofibrate and Lipitor. ?

## 2022-02-14 NOTE — Assessment & Plan Note (Signed)
BP stable.  Continue losartan and HCTZ. ?

## 2022-02-14 NOTE — Progress Notes (Signed)
? ?Subjective:  ?Patient ID: Corey [name], male    DOB: [birth year]  Age: 56 y.o. MRN: [id number] ? ?CC: ?Chief Complaint  ?Patient presents with  ? Diabetes  ?  Follow up , discuss lab results   ? ? ?HPI: ? ?56 year old male with coronary artery disease, hypertension, aortic aneurysm, hepatic steatosis, uncontrolled type 2 diabetes, hyperlipidemia presents for follow-up. ? ?Patient's most recent A1c reflects that his glycemic control is improving but is still uncontrolled.  A1c is 10 down from 12.6.  He is on Semglee, metformin, and glipizide.  Blood sugar this morning was 157.  He has previously declined seeing endocrinology.  We will discuss this today.  Patient needs better glycemic control to prevent further complications. ? ?BP stable on losartan and HCTZ. ? ?Patient is overdue for follow-up regarding aortic aneurysm.  Needs follow-up CT scan. ? ?Patient's lipids are fairly well controlled excluding triglycerides which are markedly elevated despite fenofibrate and Lipitor.  This will likely improve with better glycemic control. ? ?Patient Active Problem List  ? Diagnosis Date Noted  ? Hepatic steatosis 11/08/2021  ? CAD (coronary artery disease) 10/11/2021  ? Ascending aortic aneurysm (HCC) 10/11/2021  ? Uncontrolled type 2 diabetes mellitus with hyperglycemia (HCC) 10/07/2021  ? Mixed hyperlipidemia 02/09/2013  ? Essential hypertension, benign 02/09/2013  ? ? ?Social Hx   ?Social History  ? ?Socioeconomic History  ? Marital status: Married  ?  Spouse name: Not on file  ? Number of children: Not on file  ? Years of education: Not on file  ? Highest education level: Not on file  ?Occupational History  ? Not on file  ?Tobacco Use  ? Smoking status: Former  ?  Packs/day: 0.50  ?  Years: 10.00  ?  Pack years: 5.00  ?  Types: Cigarettes  ?  Quit date: 02/07/1993  ?  Years since quitting: 29.0  ? Smokeless tobacco: Never  ?Substance and Sexual Activity  ? Alcohol use: Yes  ?  Comment: occasionally  ? Drug use: No  ?  Sexual activity: Yes  ?Other Topics Concern  ? Not on file  ?Social History Narrative  ? Not on file  ? ?Social Determinants of Health  ? ?Financial Resource Strain: Not on file  ?Food Insecurity: Not on file  ?Transportation Needs: Not on file  ?Physical Activity: Not on file  ?Stress: Not on file  ?Social Connections: Not on file  ? ? ?Review of Systems  ?Constitutional: Negative.   ?Respiratory: Negative.    ?Cardiovascular: Negative.   ? ?Objective:  ?BP 140/78   Pulse 88   Temp (!) 97.2 ?F (36.2 ?C)   Ht 5\' 7"  (1.702 m)   Wt 209 lb (94.8 kg)   SpO2 96%   BMI 32.73 kg/m?  ? ? ?  02/14/2022  ?  8:44 AM 11/08/2021  ?  8:46 AM 11/08/2021  ?  8:23 AM  ?BP/Weight  ?Systolic BP 140 150 162  ?Diastolic BP 78 84 83  ?Wt. (Lbs) 209  209.2  ?BMI 32.73 kg/m2  32.77 kg/m2  ? ? ?Physical Exam ?Vitals and nursing note reviewed.  ?Constitutional:   ?   General: He is not in acute distress. ?   Appearance: Normal appearance.  ?HENT:  ?   Head: Normocephalic and atraumatic.  ?Cardiovascular:  ?   Rate and Rhythm: Normal rate and regular rhythm.  ?Pulmonary:  ?   Effort: Pulmonary effort is normal.  ?   Breath sounds: Normal breath sounds.  ?  Abdominal:  ?   General: There is no distension.  ?   Palpations: Abdomen is soft.  ?   Tenderness: There is no abdominal tenderness.  ?Neurological:  ?   Mental Status: He is alert.  ?Psychiatric:     ?   Mood and Affect: Mood normal.     ?   Behavior: Behavior normal.  ? ? ?Lab Results  ?Component Value Date  ? WBC 3.1 (L) 02/09/2022  ? HGB 12.4 (L) 02/09/2022  ? HCT 37.1 (L) 02/09/2022  ? PLT 248 02/09/2022  ? GLUCOSE 210 (H) 02/09/2022  ? CHOL 144 02/09/2022  ? TRIG 493 (H) 02/09/2022  ? HDL 19 (L) 02/09/2022  ? LDLCALC 52 02/09/2022  ? ALT 30 02/09/2022  ? AST 26 02/09/2022  ? NA 140 02/09/2022  ? K 4.3 02/09/2022  ? CL 103 02/09/2022  ? CREATININE 1.01 02/09/2022  ? BUN 15 02/09/2022  ? CO2 20 02/09/2022  ? TSH 1.239 10/05/2014  ? PSA 0.58 03/02/2014  ? INR 1.04 07/12/2017  ? HGBA1C  10.0 (H) 02/09/2022  ? MICROALBUR 10.6 (H) 10/05/2014  ? ? ? ?Assessment & Plan:  ? ?Problem List Items Addressed This Visit   ? ?  ? Cardiovascular and Mediastinum  ? Essential hypertension, benign  ?  BP stable.  Continue losartan and HCTZ. ? ?  ?  ? Ascending aortic aneurysm (HCC) - Primary  ?  CT ordered. ? ?  ?  ? Relevant Orders  ? CT ANGIO CHEST AORTA W/CM & OR WO/CM  ?  ? Endocrine  ? Uncontrolled type 2 diabetes mellitus with hyperglycemia (HCC)  ?  Remains uncontrolled.  Continue current medications.  Referring to endocrinology. ? ?  ?  ? Relevant Orders  ? Ambulatory referral to Endocrinology  ?  ? Other  ? Mixed hyperlipidemia  ?  Needs better control of diabetes to aid in triglycerides.  Likely needs additional dietary changes.  Continue fenofibrate and Lipitor. ? ?  ?  ? ? ?Follow-up:  Return in about 6 months (around 08/16/2022). ? ?Everlene Other DO ?[facility] ? ?

## 2022-02-24 ENCOUNTER — Other Ambulatory Visit: Payer: Self-pay | Admitting: Family Medicine

## 2022-03-01 ENCOUNTER — Ambulatory Visit (HOSPITAL_BASED_OUTPATIENT_CLINIC_OR_DEPARTMENT_OTHER)
Admission: RE | Admit: 2022-03-01 | Discharge: 2022-03-01 | Disposition: A | Discharge status: Home / Self Care | Payer: Managed Care, Other (non HMO) | Source: Ambulatory Visit | Attending: Family Medicine | Admitting: Family Medicine

## 2022-03-01 ENCOUNTER — Encounter (HOSPITAL_BASED_OUTPATIENT_CLINIC_OR_DEPARTMENT_OTHER): Payer: Self-pay

## 2022-03-01 DIAGNOSIS — I7121 Aneurysm of the ascending aorta, without rupture: Secondary | ICD-10-CM | POA: Insufficient documentation

## 2022-03-01 MED ORDER — IOHEXOL 350 MG/ML SOLN
100.0000 mL | Freq: Once | INTRAVENOUS | Status: AC | PRN
  Administered 2022-03-01: 100 mL via INTRAVENOUS

## 2022-03-03 NOTE — Progress Notes (Signed)
Mychart message sent.

## 2022-03-08 ENCOUNTER — Ambulatory Visit (INDEPENDENT_AMBULATORY_CARE_PROVIDER_SITE_OTHER): Payer: Managed Care, Other (non HMO) | Admitting: Nurse Practitioner

## 2022-03-08 ENCOUNTER — Encounter: Payer: Self-pay | Admitting: Nurse Practitioner

## 2022-03-08 ENCOUNTER — Other Ambulatory Visit: Payer: Self-pay

## 2022-03-08 VITALS — BP 140/87 | HR 95 | Ht 67.0 in | Wt 208.0 lb

## 2022-03-08 DIAGNOSIS — E782 Mixed hyperlipidemia: Secondary | ICD-10-CM

## 2022-03-08 DIAGNOSIS — E1165 Type 2 diabetes mellitus with hyperglycemia: Secondary | ICD-10-CM | POA: Diagnosis not present

## 2022-03-08 DIAGNOSIS — Z794 Long term (current) use of insulin: Secondary | ICD-10-CM

## 2022-03-08 DIAGNOSIS — I10 Essential (primary) hypertension: Secondary | ICD-10-CM | POA: Diagnosis not present

## 2022-03-08 DIAGNOSIS — E119 Type 2 diabetes mellitus without complications: Secondary | ICD-10-CM | POA: Diagnosis not present

## 2022-03-08 MED ORDER — GLIPIZIDE 5 MG PO TABS: 5.0000 mg | ORAL_TABLET | Freq: Two times a day (BID) | ORAL | 2 refills | Status: DC

## 2022-03-08 MED ORDER — METFORMIN HCL 500 MG PO TABS: 1000.0000 mg | ORAL_TABLET | Freq: Two times a day (BID) | ORAL | 0 refills | Status: DC

## 2022-03-08 MED ORDER — LOSARTAN POTASSIUM 50 MG PO TABS: 100.0000 mg | ORAL_TABLET | Freq: Every day | ORAL | 0 refills | Status: DC

## 2022-03-08 MED ORDER — DEXCOM G6 SENSOR MISC: 3 refills | Status: DC

## 2022-03-08 MED ORDER — DEXCOM G6 TRANSMITTER MISC: 3 refills | Status: DC

## 2022-03-08 MED ORDER — SEMGLEE (YFGN) 100 UNIT/ML ~~LOC~~ SOPN: 20.0000 [IU] | PEN_INJECTOR | Freq: Every day | SUBCUTANEOUS | 0 refills | Status: DC

## 2022-03-08 MED ORDER — METFORMIN HCL 500 MG PO TABS: 1000.0000 mg | ORAL_TABLET | Freq: Two times a day (BID) | ORAL | 2 refills | Status: DC

## 2022-03-08 NOTE — Progress Notes (Signed)
? ?                                                    Endocrinology Consult Note  ?     03/08/2022, 2:02 PM ? ? ?Subjective:  ? ? Patient ID: Corey [name], male    DOB: [birth year].  ?Corey [name] is being seen in consultation for management of currently uncontrolled symptomatic diabetes requested by  Coral Spikes, DO. ? ? ?Past Medical History:  ?Diagnosis Date  ? Essential hypertension   ? Hypertriglyceridemia   ? Type 2 diabetes mellitus ([place])   ? ? ?Past Surgical History:  ?Procedure Laterality Date  ? COLONOSCOPY N/A 10/14/2014  ? Procedure: COLONOSCOPY;  Surgeon: Daneil Dolin, MD;  [facility];  Service: Endoscopy;  Laterality: N/A;  8:30 AM  ? LEFT HEART CATH AND CORONARY ANGIOGRAPHY N/A 07/13/2017  ? Procedure: LEFT HEART CATH AND CORONARY ANGIOGRAPHY;  Surgeon: Burnell Blanks, MD;  Location: [facility];  Service: Cardiovascular;  Laterality: N/A;  ? NO PAST SURGERIES    ? ? ?Social History  ? ?Socioeconomic History  ? Marital status: Married  ?  Spouse name: Not on file  ? Number of children: Not on file  ? Years of education: Not on file  ? Highest education level: Not on file  ?Occupational History  ? Not on file  ?Tobacco Use  ? Smoking status: Former  ?  Packs/day: 0.50  ?  Years: 10.00  ?  Pack years: 5.00  ?  Types: Cigarettes  ?  Quit date: 02/07/1993  ?  Years since quitting: 29.0  ? Smokeless tobacco: Never  ?Vaping Use  ? Vaping Use: Never used  ?Substance and Sexual Activity  ? Alcohol use: Yes  ?  Comment: occasionally  ? Drug use: No  ? Sexual activity: Yes  ?Other Topics Concern  ? Not on file  ?Social History Narrative  ? Not on file  ? ?Social Determinants of Health  ? ?Financial Resource Strain: Not on file  ?Food Insecurity: Not on file  ?Transportation Needs: Not on file  ?Physical Activity: Not on file  ?Stress: Not on file  ?Social Connections: Not on file  ? ? ?Family History  ?Problem Relation Age of Onset  ? Colon cancer Other   ? Cancer  Father   ? Hypertension Father   ? Diabetes Father   ? Heart attack Father   ? Colon cancer Maternal Uncle   ? ? ?Outpatient Encounter Medications as of 03/08/2022  ?Medication Sig  ? Continuous Blood Gluc Sensor (DEXCOM G6 SENSOR) MISC Change sensor every 10 days as directed  ? Continuous Blood Gluc Transmit (DEXCOM G6 TRANSMITTER) MISC Change transmitter every 90 days as directed.  ? Alcohol Swabs (ALCOHOL PADS) 70 % PADS Use  As directed with lantus, nightly.  ? aspirin 81 MG EC tablet Take 1 tablet (81 mg total) by mouth daily.  ? atorvastatin (LIPITOR) 20 MG tablet Take 1 tablet by mouth once daily  ? BD PEN NEEDLE MICRO U/F 32G X 6 MM MISC USE AS DIRECTED  ? blood glucose meter kit and supplies KIT Dispense based on patient and insurance preference. Test blood sugar once daily. Type 2 diabetes. E11.9  ? fenofibrate 160 MG tablet Take 1 tablet by mouth once daily  ?  glipiZIDE (GLUCOTROL) 5 MG tablet Take 1 tablet (5 mg total) by mouth 2 (two) times daily before a meal. Take 1 tab p.o. in AM and then take 2 tab p.o. in evening with meals.  ? glucose blood (CONTOUR NEXT TEST) test strip USE 1 STRIP TO CHECK GLUCOSE ONCE DAILY  ? hydrochlorothiazide (HYDRODIURIL) 25 MG tablet Take 1 tablet by mouth once daily  ? Insulin Syringe-Needle U-100 (INSULIN SYRINGE .5CC/31GX5/16") 31G X 5/16" 0.5 ML MISC Use as directed nightly.  ? losartan (COZAAR) 50 MG tablet Take 2 tablets (100 mg total) by mouth daily.  ? metFORMIN (GLUCOPHAGE) 500 MG tablet Take 2 tablets (1,000 mg total) by mouth 2 (two) times daily with a meal. Take 2 tab p.o. am, then 1 tab at noon, then 2 tab p.o. pm with meals.  ? SEMGLEE, YFGN, 100 UNIT/ML Pen Inject 20 Units into the skin at bedtime.  ? [DISCONTINUED] glipiZIDE (GLUCOTROL) 5 MG tablet Take 1 tab p.o. in AM and then take 2 tab p.o. in evening with meals.  ? [DISCONTINUED] losartan (COZAAR) 50 MG tablet Take 2 tablets by mouth once daily  ? [DISCONTINUED] metFORMIN (GLUCOPHAGE) 500 MG tablet  Take 2 tab p.o. am, then 1 tab at noon, then 2 tab p.o. pm with meals.  ? [DISCONTINUED] SEMGLEE, YFGN, 100 UNIT/ML Pen INJECT 30 UNITS SUBCUTANEOUSLY TWICE DAILY  ? ?No facility-administered encounter medications on file as of 03/08/2022.  ? ? ?ALLERGIES: ?No Known Allergies ? ?VACCINATION STATUS: ?Immunization History  ?Administered Date(s) Administered  ? Influenza,inj,Quad PF,6+ Mos 08/24/2015, 08/22/2016, 07/18/2017, 06/20/2018, 07/31/2019  ? Influenza-Unspecified 08/16/2021  ? Moderna Sars-Covid-2 Vaccination 12/22/2019, 01/21/2020, 08/25/2020, 03/09/2021  ? Pension scheme manager 37yr & up 08/16/2021  ? Pneumococcal-Unspecified 07/22/2009  ? Td 10/25/2021  ? Tdap 06/20/2018  ? Zoster Recombinat (Shingrix) 10/25/2021  ? ? ?Diabetes ?He presents for his initial diabetic visit. He has type 2 diabetes mellitus. Onset time: Diagnosed at approx age of 89 His disease course has been improving. Hypoglycemia symptoms include sweats and tremors. Associated symptoms include fatigue and polyuria. Hypoglycemia complications include nocturnal hypoglycemia. Symptoms are stable. Diabetic complications include heart disease and nephropathy. Risk factors for coronary artery disease include diabetes mellitus, dyslipidemia, family history, obesity, male sex, hypertension and sedentary lifestyle. Current diabetic treatment includes insulin injections and oral agent (dual therapy). He is compliant with treatment most of the time. His weight is stable. He is following a generally unhealthy diet. When asked about meal planning, he reported none. He has not had a previous visit with a dietitian. He rarely participates in exercise. (He presents today for his consultation, accompanied by his wife, with no meter or logs to review.  His most recent A1c on 02/09/22 was 10%, down from 12.6% previously.  He does not routinely monitor glucose but 1-2 times per week.  He drinks artificially sweetened beverages such as zero  sugar soda along with his water.  He does not eat on any routine pattern and snacks between.  He does not engage in routine exercise and is UTD on eye exam.  He has never seen podiatry in the past.) An ACE inhibitor/angiotensin II receptor blocker is being taken. He does not see a podiatrist.Eye exam is current.  ?Hyperlipidemia ?This is a chronic problem. The current episode started more than 1 year ago. The problem is uncontrolled. Recent lipid tests were reviewed and are variable. Exacerbating diseases include chronic renal disease, diabetes and obesity. Factors aggravating his hyperlipidemia include thiazides. Current antihyperlipidemic  treatment includes statins and fibric acid derivatives. Compliance problems include adherence to diet and adherence to exercise.  Risk factors for coronary artery disease include diabetes mellitus, dyslipidemia, family history, obesity, male sex, hypertension and a sedentary lifestyle.  ?Hypertension ?This is a chronic problem. The current episode started more than 1 year ago. The problem has been resolved since onset. The problem is controlled. Associated symptoms include sweats. There are no associated agents to hypertension. Risk factors for coronary artery disease include diabetes mellitus, obesity, male gender, dyslipidemia, family history and sedentary lifestyle. Past treatments include diuretics and angiotensin blockers. The current treatment provides mild improvement. Compliance problems include diet and exercise.  Hypertensive end-organ damage includes kidney disease and CAD/MI. Identifiable causes of hypertension include chronic renal disease.  ? ? ?Review of systems ? ?Constitutional: + Minimally fluctuating body weight, current Body mass index is 32.58 kg/m?., no fatigue, no subjective hyperthermia, no subjective hypothermia ?Eyes: no blurry vision, no xerophthalmia ?ENT: no sore throat, no nodules palpated in throat, no dysphagia/odynophagia, no  hoarseness ?Cardiovascular: no chest pain, no shortness of breath, no palpitations, no leg swelling ?Respiratory: no cough, no shortness of breath ?Gastrointestinal: no nausea/vomiting/diarrhea ?Musculoskeletal: no muscle/joint

## 2022-03-08 NOTE — Patient Instructions (Signed)

## 2022-03-16 ENCOUNTER — Other Ambulatory Visit: Payer: Self-pay | Admitting: Nurse Practitioner

## 2022-03-16 MED ORDER — DEXCOM G6 RECEIVER DEVI: 0 refills | Status: DC

## 2022-03-28 ENCOUNTER — Ambulatory Visit: Payer: Managed Care, Other (non HMO) | Admitting: Nurse Practitioner

## 2022-04-04 ENCOUNTER — Other Ambulatory Visit: Payer: Self-pay | Admitting: Family Medicine

## 2022-04-04 ENCOUNTER — Other Ambulatory Visit: Payer: Self-pay

## 2022-04-04 MED ORDER — GLIPIZIDE 5 MG PO TABS: ORAL_TABLET | ORAL | 0 refills | Status: DC

## 2022-05-11 ENCOUNTER — Other Ambulatory Visit: Payer: Self-pay | Admitting: Family Medicine

## 2022-05-11 DIAGNOSIS — E782 Mixed hyperlipidemia: Secondary | ICD-10-CM

## 2022-06-14 ENCOUNTER — Other Ambulatory Visit: Payer: Self-pay | Admitting: Nurse Practitioner

## 2022-07-03 ENCOUNTER — Other Ambulatory Visit: Payer: Self-pay | Admitting: Nurse Practitioner

## 2022-07-19 ENCOUNTER — Other Ambulatory Visit: Payer: Self-pay | Admitting: Family Medicine

## 2022-07-23 ENCOUNTER — Other Ambulatory Visit: Payer: Self-pay | Admitting: Family Medicine

## 2022-07-26 ENCOUNTER — Other Ambulatory Visit: Payer: Self-pay | Admitting: Nurse Practitioner

## 2022-07-26 DIAGNOSIS — E119 Type 2 diabetes mellitus without complications: Secondary | ICD-10-CM

## 2022-08-01 ENCOUNTER — Other Ambulatory Visit: Payer: Self-pay | Admitting: Nurse Practitioner

## 2022-08-01 ENCOUNTER — Telehealth: Payer: Self-pay | Admitting: Family Medicine

## 2022-08-01 DIAGNOSIS — E119 Type 2 diabetes mellitus without complications: Secondary | ICD-10-CM

## 2022-08-01 MED ORDER — METFORMIN HCL 500 MG PO TABS: 1000.0000 mg | ORAL_TABLET | Freq: Two times a day (BID) | ORAL | 0 refills | Status: DC

## 2022-08-01 NOTE — Telephone Encounter (Signed)
Patient is requesting a refill on metformin 500 mg sent to [facility]

## 2022-08-23 LAB — HM DIABETES EYE EXAM

## 2022-09-02 ENCOUNTER — Other Ambulatory Visit: Payer: Self-pay | Admitting: Family Medicine

## 2022-09-13 ENCOUNTER — Other Ambulatory Visit: Payer: Self-pay | Admitting: Family Medicine

## 2022-09-13 DIAGNOSIS — E782 Mixed hyperlipidemia: Secondary | ICD-10-CM

## 2022-09-26 ENCOUNTER — Telehealth: Payer: Self-pay

## 2022-09-26 DIAGNOSIS — E119 Type 2 diabetes mellitus without complications: Secondary | ICD-10-CM

## 2022-09-26 DIAGNOSIS — E782 Mixed hyperlipidemia: Secondary | ICD-10-CM

## 2022-09-26 DIAGNOSIS — Z79899 Other long term (current) drug therapy: Secondary | ICD-10-CM

## 2022-09-26 DIAGNOSIS — Z125 Encounter for screening for malignant neoplasm of prostate: Secondary | ICD-10-CM

## 2022-09-26 NOTE — Telephone Encounter (Signed)
Caller name: Takari Lundahl  On DPR?: Yes  Call back number: [phone] (mobile)  Provider they see: Tommie Sams, DO  Reason for call:Pt called and wants lab work ordered before follow up appt next week

## 2022-09-26 NOTE — Telephone Encounter (Signed)
Last labs 02/09/22: HgbA1c, Lipid, CMP, CBC

## 2022-09-27 NOTE — Telephone Encounter (Signed)
Blood work ordered in EPIC. Left message to return call  

## 2022-09-27 NOTE — Addendum Note (Signed)
Addended by: Margaretha Sheffield on: 09/27/2022 01:42 PM   Modules accepted: Orders

## 2022-09-27 NOTE — Telephone Encounter (Signed)
Cook, Jayce G, DO   CBC, CMP, Lipid, A1C, Urine ACR, PSA.

## 2022-10-03 ENCOUNTER — Ambulatory Visit: Payer: Managed Care, Other (non HMO) | Admitting: Family Medicine

## 2022-10-09 NOTE — Telephone Encounter (Signed)
Voicemail left on phone letting pt know that labs are ordered (per DPR ok to leave message on cell)

## 2022-12-19 ENCOUNTER — Other Ambulatory Visit: Payer: Self-pay | Admitting: Nurse Practitioner

## 2022-12-19 ENCOUNTER — Other Ambulatory Visit: Payer: Self-pay | Admitting: Family Medicine

## 2022-12-19 DIAGNOSIS — E782 Mixed hyperlipidemia: Secondary | ICD-10-CM

## 2022-12-27 ENCOUNTER — Other Ambulatory Visit: Payer: Self-pay | Admitting: *Deleted

## 2022-12-27 DIAGNOSIS — E782 Mixed hyperlipidemia: Secondary | ICD-10-CM

## 2022-12-27 MED ORDER — HYDROCHLOROTHIAZIDE 25 MG PO TABS: 25.0000 mg | ORAL_TABLET | Freq: Every day | ORAL | 0 refills | Status: DC

## 2022-12-27 MED ORDER — FENOFIBRATE 160 MG PO TABS: 160.0000 mg | ORAL_TABLET | Freq: Every day | ORAL | 0 refills | Status: DC

## 2022-12-27 MED ORDER — GLIPIZIDE 5 MG PO TABS: ORAL_TABLET | ORAL | 0 refills | Status: DC

## 2022-12-27 MED ORDER — SEMGLEE (YFGN) 100 UNIT/ML ~~LOC~~ SOPN: PEN_INJECTOR | SUBCUTANEOUS | 1 refills | Status: DC

## 2022-12-27 MED ORDER — ATORVASTATIN CALCIUM 20 MG PO TABS: 20.0000 mg | ORAL_TABLET | Freq: Every day | ORAL | 0 refills | Status: DC

## 2022-12-28 ENCOUNTER — Other Ambulatory Visit: Payer: Self-pay | Admitting: Family Medicine

## 2022-12-28 DIAGNOSIS — E119 Type 2 diabetes mellitus without complications: Secondary | ICD-10-CM

## 2023-01-11 ENCOUNTER — Other Ambulatory Visit: Payer: Self-pay | Admitting: Nurse Practitioner

## 2023-01-16 ENCOUNTER — Other Ambulatory Visit: Payer: Self-pay | Admitting: Nurse Practitioner

## 2023-01-16 ENCOUNTER — Ambulatory Visit: Payer: Managed Care, Other (non HMO) | Admitting: Internal Medicine

## 2023-01-18 ENCOUNTER — Telehealth: Payer: Self-pay | Admitting: Family Medicine

## 2023-01-18 ENCOUNTER — Other Ambulatory Visit: Payer: Self-pay | Admitting: Nurse Practitioner

## 2023-01-18 MED ORDER — LOSARTAN POTASSIUM 100 MG PO TABS: 100.0000 mg | ORAL_TABLET | Freq: Every day | ORAL | 0 refills | Status: DC

## 2023-01-18 NOTE — Telephone Encounter (Signed)
Patient is requesting new prescription be sent in for losartan 100 mg it was last filled for  50 mg by Corey [name]  07/04/22.  [facility] His next appointment is not until April . Please advise

## 2023-01-18 NOTE — Telephone Encounter (Signed)
Coral Spikes, DO    He is taking 2 of the 50 mg tablets. Okay to refill as is.

## 2023-01-18 NOTE — Telephone Encounter (Signed)
Prescription sent electronically to pharmacy. Patient notified. 

## 2023-01-18 NOTE — Telephone Encounter (Signed)
Cook, Jayce G, DO    Yes.   

## 2023-01-23 ENCOUNTER — Other Ambulatory Visit: Payer: Self-pay | Admitting: Family Medicine

## 2023-02-16 LAB — CBC WITH DIFFERENTIAL/PLATELET
Basophils Absolute: 0 10*3/uL (ref 0.0–0.2)
Basos: 1 %
EOS (ABSOLUTE): 0 10*3/uL (ref 0.0–0.4)
Eos: 1 %
Hematocrit: 40.2 % (ref 37.5–51.0)
Hemoglobin: 13.4 g/dL (ref 13.0–17.7)
Immature Grans (Abs): 0 10*3/uL (ref 0.0–0.1)
Immature Granulocytes: 0 %
Lymphocytes Absolute: 1.6 10*3/uL (ref 0.7–3.1)
Lymphs: 35 %
MCH: 27.4 pg (ref 26.6–33.0)
MCHC: 33.3 g/dL (ref 31.5–35.7)
MCV: 82 fL (ref 79–97)
Monocytes Absolute: 0.4 10*3/uL (ref 0.1–0.9)
Monocytes: 9 %
Neutrophils Absolute: 2.3 10*3/uL (ref 1.4–7.0)
Neutrophils: 54 %
Platelets: 244 10*3/uL (ref 150–450)
RBC: 4.89 x10E6/uL (ref 4.14–5.80)
RDW: 14.1 % (ref 11.6–15.4)
WBC: 4.4 10*3/uL (ref 3.4–10.8)

## 2023-02-16 LAB — LIPID PANEL
Chol/HDL Ratio: 7.9 ratio — ABNORMAL HIGH (ref 0.0–5.0)
Cholesterol, Total: 174 mg/dL (ref 100–199)
HDL: 22 mg/dL — ABNORMAL LOW (ref >39)
LDL Chol Calc (NIH): 50 mg/dL (ref 0–99)
Triglycerides: 700 mg/dL (ref 0–149)
VLDL Cholesterol Cal: 102 mg/dL — ABNORMAL HIGH (ref 5–40)

## 2023-02-16 LAB — MICROALBUMIN / CREATININE URINE RATIO
Creatinine, Urine: 135 mg/dL
Microalb/Creat Ratio: 192 mg/g creat — ABNORMAL HIGH (ref 0–29)
Microalbumin, Urine: 259.7 ug/mL

## 2023-02-16 LAB — CMP14+EGFR
ALT: 29 IU/L (ref 0–44)
AST: 27 IU/L (ref 0–40)
Albumin/Globulin Ratio: 1.4 (ref 1.2–2.2)
Albumin: 4.7 g/dL (ref 3.8–4.9)
Alkaline Phosphatase: 40 IU/L — ABNORMAL LOW (ref 44–121)
BUN/Creatinine Ratio: 16 (ref 9–20)
BUN: 20 mg/dL (ref 6–24)
Bilirubin Total: 0.6 mg/dL (ref 0.0–1.2)
CO2: 20 mmol/L (ref 20–29)
Calcium: 11 mg/dL — ABNORMAL HIGH (ref 8.7–10.2)
Chloride: 100 mmol/L (ref 96–106)
Creatinine, Ser: 1.24 mg/dL (ref 0.76–1.27)
Globulin, Total: 3.4 g/dL (ref 1.5–4.5)
Glucose: 304 mg/dL — ABNORMAL HIGH (ref 70–99)
Potassium: 4.7 mmol/L (ref 3.5–5.2)
Sodium: 136 mmol/L (ref 134–144)
Total Protein: 8.1 g/dL (ref 6.0–8.5)
eGFR: 67 mL/min/{1.73_m2} (ref >59)

## 2023-02-16 LAB — HEMOGLOBIN A1C
Est. average glucose Bld gHb Est-mCnc: 312 mg/dL
Hgb A1c MFr Bld: 12.5 % — ABNORMAL HIGH (ref 4.8–5.6)

## 2023-02-16 LAB — PSA: Prostate Specific Ag, Serum: 0.9 ng/mL (ref 0.0–4.0)

## 2023-02-20 ENCOUNTER — Ambulatory Visit: Payer: Managed Care, Other (non HMO) | Admitting: Family Medicine

## 2023-02-20 VITALS — BP 126/78 | HR 112 | Temp 96.8°F | Ht 67.0 in | Wt 197.0 lb

## 2023-02-20 DIAGNOSIS — E782 Mixed hyperlipidemia: Secondary | ICD-10-CM | POA: Diagnosis not present

## 2023-02-20 DIAGNOSIS — I7121 Aneurysm of the ascending aorta, without rupture: Secondary | ICD-10-CM

## 2023-02-20 DIAGNOSIS — E1165 Type 2 diabetes mellitus with hyperglycemia: Secondary | ICD-10-CM | POA: Diagnosis not present

## 2023-02-20 DIAGNOSIS — I10 Essential (primary) hypertension: Secondary | ICD-10-CM | POA: Diagnosis not present

## 2023-02-20 DIAGNOSIS — Z794 Long term (current) use of insulin: Secondary | ICD-10-CM

## 2023-02-20 DIAGNOSIS — N182 Chronic kidney disease, stage 2 (mild): Secondary | ICD-10-CM | POA: Insufficient documentation

## 2023-02-20 MED ORDER — FREESTYLE LIBRE 3 SENSOR MISC: 3 refills | Status: DC

## 2023-02-20 MED ORDER — TIRZEPATIDE 2.5 MG/0.5ML ~~LOC~~ SOAJ: 2.5000 mg | SUBCUTANEOUS | 0 refills | Status: DC

## 2023-02-20 MED ORDER — SEMGLEE (YFGN) 100 UNIT/ML ~~LOC~~ SOPN: 35.0000 [IU] | PEN_INJECTOR | Freq: Two times a day (BID) | SUBCUTANEOUS | 1 refills | Status: DC

## 2023-02-20 NOTE — Progress Notes (Signed)
Subjective:  Patient ID: Corey [name], male    DOB: August 23, 1966  Age: 57 y.o. MRN: [id number]  CC: Chief Complaint  Patient presents with   Diabetes    Discuss lab results , discuss new medications     HPI:  57 year old male with uncontrolled type 2 diabetes, ascending aortic aneurysm, coronary disease status post NSTEMI, hypertension, hepatic steatosis, hyperlipidemia presents for follow-up.  Type 2 diabetes remains uncontrolled.  Control worsening.  A1c 12.5.  He is not checking his blood sugars consistently.  He is currently taking metformin, glipizide, and 30 units twice daily of Semglee.  He is interested in starting GLP-1 medication.  He states that overall he is feeling well.  He has no concerning symptoms at this time.  LDL is at goal.  Triglycerides markedly elevated and uncontrolled due to the fact that his diabetes is uncontrolled.  Patient has significant proteinuria.  Microalbumin to creatinine ratio 192.  He is on ARB.  Patient Active Problem List   Diagnosis Date Noted   CKD (chronic kidney disease) stage 2, GFR 60-89 ml/min 02/20/2023   Hepatic steatosis 11/08/2021   CAD (coronary artery disease) 10/11/2021   Ascending aortic aneurysm (HCC) 10/11/2021   Uncontrolled type 2 diabetes mellitus with hyperglycemia (HCC) 10/07/2021   Mixed hyperlipidemia 02/09/2013   Essential hypertension, benign 02/09/2013    Social Hx   Social History   Socioeconomic History   Marital status: Married    Spouse name: Not on file   Number of children: Not on file   Years of education: Not on file   Highest education level: Not on file  Occupational History   Not on file  Tobacco Use   Smoking status: Former    Packs/day: 0.50    Years: 10.00    Additional pack years: 0.00    Total pack years: 5.00    Types: Cigarettes    Quit date: 02/07/1993    Years since quitting: 30.0   Smokeless tobacco: Never  Vaping Use   Vaping Use: Never used  Substance and Sexual Activity    Alcohol use: Yes    Comment: occasionally   Drug use: No   Sexual activity: Yes  Other Topics Concern   Not on file  Social History Narrative   Not on file   Social Determinants of Health   Financial Resource Strain: Not on file  Food Insecurity: Not on file  Transportation Needs: Not on file  Physical Activity: Not on file  Stress: Not on file  Social Connections: Not on file    Review of Systems Per HPI  Objective:  BP 126/78   Pulse (!) 112   Temp (!) 96.8 F (36 C)   Ht 5\' 7"  (1.702 m)   Wt 197 lb (89.4 kg)   SpO2 98%   BMI 30.85 kg/m      02/20/2023    9:44 AM 03/08/2022    9:38 AM 02/14/2022    8:44 AM  BP/Weight  Systolic BP 126 140 140  Diastolic BP 78 87 78  Wt. (Lbs) 197 208 209  BMI 30.85 kg/m2 32.58 kg/m2 32.73 kg/m2    Physical Exam Constitutional:      General: He is not in acute distress.    Appearance: Normal appearance.  HENT:     Head: Normocephalic and atraumatic.  Eyes:     General:        Right eye: No discharge.        Left eye:  No discharge.     Conjunctiva/sclera: Conjunctivae normal.  Cardiovascular:     Rate and Rhythm: Regular rhythm. Tachycardia present.  Pulmonary:     Effort: Pulmonary effort is normal.     Breath sounds: Normal breath sounds.  Neurological:     Mental Status: He is alert.     Lab Results  Component Value Date   WBC 4.4 02/15/2023   HGB 13.4 02/15/2023   HCT 40.2 02/15/2023   PLT 244 02/15/2023   GLUCOSE 304 (H) 02/15/2023   CHOL 174 02/15/2023   TRIG 700 (HH) 02/15/2023   HDL 22 (L) 02/15/2023   LDLCALC 50 02/15/2023   ALT 29 02/15/2023   AST 27 02/15/2023   NA 136 02/15/2023   K 4.7 02/15/2023   CL 100 02/15/2023   CREATININE 1.24 02/15/2023   BUN 20 02/15/2023   CO2 20 02/15/2023   TSH 1.239 10/05/2014   PSA 0.58 03/02/2014   INR 1.04 07/12/2017   HGBA1C 12.5 (H) 02/15/2023   MICROALBUR 10.6 (H) 10/05/2014     Assessment & Plan:   Problem List Items Addressed This Visit        Cardiovascular and Mediastinum   Ascending aortic aneurysm (HCC)   Relevant Orders   CT Chest W Contrast   Essential hypertension, benign    BP at goal on HCTZ and losartan.        Endocrine   Uncontrolled type 2 diabetes mellitus with hyperglycemia (HCC) - Primary    Patient states that he could not afford to continue to see endocrinology. Uncontrolled and worsening.  Increasing Semglee to 35 units twice daily.  Stopping glipizide.  Adding Mounjaro.  Continue metformin. Advised that he needs to check his blood sugars consistently.  I sent in a prescription for freestyle libre as well.      Relevant Medications   SEMGLEE, YFGN, 100 UNIT/ML Pen   tirzepatide (MOUNJARO) 2.5 MG/0.5ML Pen   Other Relevant Orders   AMB Referral to Pharmacy Medication Management     Genitourinary   CKD (chronic kidney disease) stage 2, GFR 60-89 ml/min    GFR 67.  Proteinuria with microalbumin to creatinine ratio of 192.  Needs better control of diabetes.  Is on ARB.        Other   Mixed hyperlipidemia    LDL at goal.  Needs better control triglycerides.  Needs better control of sugar.       Meds ordered this encounter  Medications   SEMGLEE, YFGN, 100 UNIT/ML Pen    Sig: Inject 35 Units into the skin 2 (two) times daily.    Dispense:  30 mL    Refill:  1   tirzepatide (MOUNJARO) 2.5 MG/0.5ML Pen    Sig: Inject 2.5 mg into the skin once a week.    Dispense:  2 mL    Refill:  0   Continuous Glucose Sensor (FREESTYLE LIBRE 3 SENSOR) MISC    Sig: Use to check blood sugars continuously. Change every 14 days.    Dispense:  2 each    Refill:  3    Follow-up:  Return in about 1 month (around 03/22/2023).  Everlene Other DO [facility]

## 2023-02-20 NOTE — Assessment & Plan Note (Signed)
LDL at goal.  Needs better control triglycerides.  Needs better control of sugar.

## 2023-02-20 NOTE — Assessment & Plan Note (Signed)
BP at goal on HCTZ and losartan.

## 2023-02-20 NOTE — Assessment & Plan Note (Signed)
GFR 67.  Proteinuria with microalbumin to creatinine ratio of 192.  Needs better control of diabetes.  Is on ARB.

## 2023-02-20 NOTE — Patient Instructions (Signed)
Stop the glipizide.  Mounjaro sent in. Contact [employer] if you like.  I increased the Insulin to 35 units twice daily.  Check fasting sugar daily.  Follow up in 1 month.

## 2023-02-20 NOTE — Assessment & Plan Note (Signed)
Patient states that he could not afford to continue to see endocrinology. Uncontrolled and worsening.  Increasing Semglee to 35 units twice daily.  Stopping glipizide.  Adding Mounjaro.  Continue metformin. Advised that he needs to check his blood sugars consistently.  I sent in a prescription for freestyle libre as well.

## 2023-02-26 ENCOUNTER — Telehealth: Payer: Self-pay

## 2023-02-26 ENCOUNTER — Other Ambulatory Visit: Payer: Self-pay | Admitting: Family Medicine

## 2023-02-26 MED ORDER — FREESTYLE LIBRE 3 READER DEVI: 1.0000 | Freq: Every day | 0 refills | Status: DC

## 2023-02-26 NOTE — Telephone Encounter (Signed)
Pt got Free Style phon is not compatible with the censor and will need meter to read the sensors.  Pharmacy [facility]  Pt call back [phone]

## 2023-02-28 NOTE — Telephone Encounter (Signed)
Cook, Jayce G, DO     Sent.    

## 2023-03-01 ENCOUNTER — Telehealth: Payer: Self-pay

## 2023-03-01 NOTE — Progress Notes (Signed)
   Care Guide Note  03/01/2023 Name: Corey [name] MRN: [id number] DOB: 03-21-1966  Referred by: Tommie Sams, DO Reason for referral : Care Coordination (Outreach to schedule pt to setup free style Josephine Igo )   Corey [name] is a 57 y.o. year old male who is a primary care patient of Tommie Sams, Corey [name] was referred to the pharmacist for assistance related to DM.    An unsuccessful telephone outreach was attempted today to contact the patient who was referred to the pharmacy team for assistance with medication management. Additional attempts will be made to contact the patient.   Corey [name], RMA Care Guide [facility]  [place], [place] [address] Direct Dial: [phone] [name]

## 2023-03-07 NOTE — Progress Notes (Signed)
   Care Guide Note  03/06/2023 Name: Corey [name] MRN: [id number] DOB: 1966/08/26  Referred by: Tommie Sams, DO Reason for referral : Care Coordination (Outreach to schedule pt to setup free style Josephine Igo )   Corey [name] is a 57 y.o. year old male who is a primary care patient of Tommie Sams, [place] was referred to the pharmacist for assistance related to DM.    A third unsuccessful telephone outreach was attempted today to contact the patient who was referred to the pharmacy team for assistance with medication management. The Population Health team is pleased to engage with this patient at any time in the future upon receipt of referral and should he/she be interested in assistance from the [facility] team.  Penne Lash, RMA Care Guide [facility]  [place], [place] [address] Direct Dial: [phone] [email or web address]

## 2023-03-08 ENCOUNTER — Other Ambulatory Visit: Payer: Self-pay | Admitting: Family Medicine

## 2023-03-08 DIAGNOSIS — E119 Type 2 diabetes mellitus without complications: Secondary | ICD-10-CM

## 2023-03-16 ENCOUNTER — Encounter: Payer: Self-pay | Admitting: *Deleted

## 2023-03-21 ENCOUNTER — Encounter: Payer: Self-pay | Admitting: Family Medicine

## 2023-03-21 ENCOUNTER — Ambulatory Visit: Payer: Managed Care, Other (non HMO) | Admitting: Family Medicine

## 2023-03-21 VITALS — BP 130/70 | HR 96 | Temp 99.4°F | Ht 67.0 in | Wt 198.0 lb

## 2023-03-21 DIAGNOSIS — E1165 Type 2 diabetes mellitus with hyperglycemia: Secondary | ICD-10-CM | POA: Diagnosis not present

## 2023-03-21 DIAGNOSIS — E782 Mixed hyperlipidemia: Secondary | ICD-10-CM

## 2023-03-21 DIAGNOSIS — Z7984 Long term (current) use of oral hypoglycemic drugs: Secondary | ICD-10-CM

## 2023-03-21 DIAGNOSIS — I10 Essential (primary) hypertension: Secondary | ICD-10-CM

## 2023-03-21 MED ORDER — METFORMIN HCL 500 MG PO TABS: ORAL_TABLET | ORAL | 0 refills | Status: DC

## 2023-03-21 MED ORDER — FREESTYLE LIBRE 3 SENSOR MISC: 3 refills | Status: DC

## 2023-03-21 MED ORDER — TIRZEPATIDE 5 MG/0.5ML ~~LOC~~ SOAJ
5.0000 mg | SUBCUTANEOUS | 1 refills | Status: DC
Start: 2023-03-21 — End: 2023-05-15

## 2023-03-21 NOTE — Assessment & Plan Note (Signed)
Glycemic control improving.  Increasing Mounjaro.  Continue metformin.  Continue insulin therapy. Follow-up in 2 months.

## 2023-03-21 NOTE — Progress Notes (Signed)
Subjective:  Patient ID: Corey [name], male    DOB: 09/01/1966  Age: 57 y.o. MRN: [id number]  CC: Chief Complaint  Patient presents with   Diabetes    HPI:  57 year old male with CAD, ascending aortic aneurysm, hypertension, hepatic steatosis, uncontrolled type 2 diabetes, mixed hyperlipidemia presents for follow-up.  Glycemic control improving.  He is watching his diet more carefully as he gets information from the continuous glucometer.  He needs a refill on this.  He also needs a refill on his metformin.  Tolerating Mounjaro.  Needs dose increase.  Patient needs foot exam today.  Blood pressure is well-controlled.  He is compliant with losartan and HCTZ.  LDL has been at goal on atorvastatin.  Patient Active Problem List   Diagnosis Date Noted   CKD (chronic kidney disease) stage 2, GFR 60-89 ml/min 02/20/2023   Hepatic steatosis 11/08/2021   CAD (coronary artery disease) 10/11/2021   Ascending aortic aneurysm (HCC) 10/11/2021   Uncontrolled type 2 diabetes mellitus with hyperglycemia (HCC) 10/07/2021   Mixed hyperlipidemia 02/09/2013   Essential hypertension, benign 02/09/2013    Social Hx   Social History   Socioeconomic History   Marital status: Married    Spouse name: Not on file   Number of children: Not on file   Years of education: Not on file   Highest education level: Not on file  Occupational History   Not on file  Tobacco Use   Smoking status: Former    Packs/day: 0.50    Years: 10.00    Additional pack years: 0.00    Total pack years: 5.00    Types: Cigarettes    Quit date: 02/07/1993    Years since quitting: 30.1   Smokeless tobacco: Never  Vaping Use   Vaping Use: Never used  Substance and Sexual Activity   Alcohol use: Yes    Comment: occasionally   Drug use: No   Sexual activity: Yes  Other Topics Concern   Not on file  Social History Narrative   Not on file   Social Determinants of Health   Financial Resource Strain: Not on file   Food Insecurity: Not on file  Transportation Needs: Not on file  Physical Activity: Not on file  Stress: Not on file  Social Connections: Not on file    Review of Systems  Respiratory: Negative.    Cardiovascular: Negative.     Objective:  BP 130/70   Pulse 96   Temp 99.4 F (37.4 C)   Ht 5\' 7"  (1.702 m)   Wt 198 lb (89.8 kg)   SpO2 99%   BMI 31.01 kg/m      03/21/2023    8:25 AM 02/20/2023    9:44 AM 03/08/2022    9:38 AM  BP/Weight  Systolic BP 130 126 140  Diastolic BP 70 78 87  Wt. (Lbs) 198 197 208  BMI 31.01 kg/m2 30.85 kg/m2 32.58 kg/m2    Physical Exam Vitals and nursing note reviewed.  Constitutional:      General: He is not in acute distress.    Appearance: Normal appearance. He is not ill-appearing.  Eyes:     General:        Right eye: No discharge.        Left eye: No discharge.     Conjunctiva/sclera: Conjunctivae normal.  Cardiovascular:     Rate and Rhythm: Normal rate and regular rhythm.  Pulmonary:     Effort: Pulmonary effort is normal.  Breath sounds: Normal breath sounds.  Feet:     Comments: Diabetic foot exam performed today.  Pulses intact. Sensation to monofilament intact. Athlete's foot noted.  Neurological:     Mental Status: He is alert.  Psychiatric:        Mood and Affect: Mood normal.        Behavior: Behavior normal.     Lab Results  Component Value Date   WBC 4.4 02/15/2023   HGB 13.4 02/15/2023   HCT 40.2 02/15/2023   PLT 244 02/15/2023   GLUCOSE 304 (H) 02/15/2023   CHOL 174 02/15/2023   TRIG 700 (HH) 02/15/2023   HDL 22 (L) 02/15/2023   LDLCALC 50 02/15/2023   ALT 29 02/15/2023   AST 27 02/15/2023   NA 136 02/15/2023   K 4.7 02/15/2023   CL 100 02/15/2023   CREATININE 1.24 02/15/2023   BUN 20 02/15/2023   CO2 20 02/15/2023   TSH 1.239 10/05/2014   PSA 0.58 03/02/2014   INR 1.04 07/12/2017   HGBA1C 12.5 (H) 02/15/2023   MICROALBUR 10.6 (H) 10/05/2014     Assessment & Plan:   Problem List  Items Addressed This Visit       Cardiovascular and Mediastinum   Essential hypertension, benign    Stable.  Continue losartan and HCTZ.        Endocrine   Uncontrolled type 2 diabetes mellitus with hyperglycemia (HCC) - Primary    Glycemic control improving.  Increasing Mounjaro.  Continue metformin.  Continue insulin therapy. Follow-up in 2 months.      Relevant Medications   metFORMIN (GLUCOPHAGE) 500 MG tablet   Continuous Glucose Sensor (FREESTYLE LIBRE 3 SENSOR) MISC   tirzepatide [facility]) 5 MG/0.5ML Pen     Other   Mixed hyperlipidemia    LDL has been at goal.  Triglycerides have been markedly elevated due to uncontrolled diabetes.  Continue statin and fenofibrate.       Meds ordered this encounter  Medications   metFORMIN (GLUCOPHAGE) 500 MG tablet    Sig: TAKE 2 TABLETS BY MOUTH TWICE DAILY WITH A MEAL    Dispense:  360 tablet    Refill:  0   Continuous Glucose Sensor (FREESTYLE LIBRE 3 SENSOR) MISC    Sig: Use to check blood sugars continuously. Change every 14 days.    Dispense:  6 each    Refill:  3   tirzepatide (MOUNJARO) 5 MG/0.5ML Pen    Sig: Inject 5 mg into the skin once a week.    Dispense:  2 mL    Refill:  1    Follow-up:  Return in about 2 months (around 05/21/2023).  Everlene Other DO [facility]

## 2023-03-21 NOTE — Assessment & Plan Note (Signed)
Stable.  Continue losartan and HCTZ. ?

## 2023-03-21 NOTE — Patient Instructions (Signed)
Keep up the good work.   I have sent in your medication.  Follow up in 2 months.

## 2023-03-21 NOTE — Assessment & Plan Note (Signed)
LDL has been at goal.  Triglycerides have been markedly elevated due to uncontrolled diabetes.  Continue statin and fenofibrate.

## 2023-03-27 ENCOUNTER — Other Ambulatory Visit: Payer: Self-pay | Admitting: Family Medicine

## 2023-03-27 DIAGNOSIS — E782 Mixed hyperlipidemia: Secondary | ICD-10-CM

## 2023-04-11 ENCOUNTER — Other Ambulatory Visit: Payer: Self-pay | Admitting: Family Medicine

## 2023-04-20 ENCOUNTER — Telehealth: Payer: Self-pay

## 2023-04-20 DIAGNOSIS — I7121 Aneurysm of the ascending aorta, without rupture: Secondary | ICD-10-CM

## 2023-04-20 NOTE — Telephone Encounter (Signed)
Cook, Jayce G, DO    Yes.   

## 2023-04-20 NOTE — Telephone Encounter (Signed)
CT reordered in EPIC to location patient requesting

## 2023-04-20 NOTE — Telephone Encounter (Signed)
Referral for CT Scan sent to [facility] [address] [place] charging the best price this is where he wants this sent.  [place] [phone]

## 2023-05-04 ENCOUNTER — Inpatient Hospital Stay: Admission: RE | Admit: 2023-05-04 | Payer: Managed Care, Other (non HMO) | Source: Ambulatory Visit

## 2023-05-09 ENCOUNTER — Other Ambulatory Visit: Payer: Self-pay | Admitting: Family Medicine

## 2023-05-15 ENCOUNTER — Other Ambulatory Visit: Payer: Self-pay | Admitting: Family Medicine

## 2023-05-15 DIAGNOSIS — E1165 Type 2 diabetes mellitus with hyperglycemia: Secondary | ICD-10-CM

## 2023-05-23 ENCOUNTER — Ambulatory Visit: Payer: Managed Care, Other (non HMO) | Admitting: Family Medicine

## 2023-05-29 ENCOUNTER — Other Ambulatory Visit: Payer: Self-pay | Admitting: *Deleted

## 2023-05-29 ENCOUNTER — Ambulatory Visit: Payer: Managed Care, Other (non HMO) | Admitting: Family Medicine

## 2023-05-29 VITALS — BP 120/70 | HR 95 | Temp 97.3°F | Ht 67.0 in | Wt 194.8 lb

## 2023-05-29 DIAGNOSIS — I7121 Aneurysm of the ascending aorta, without rupture: Secondary | ICD-10-CM

## 2023-05-29 DIAGNOSIS — Z7984 Long term (current) use of oral hypoglycemic drugs: Secondary | ICD-10-CM

## 2023-05-29 DIAGNOSIS — I10 Essential (primary) hypertension: Secondary | ICD-10-CM | POA: Diagnosis not present

## 2023-05-29 DIAGNOSIS — E1165 Type 2 diabetes mellitus with hyperglycemia: Secondary | ICD-10-CM | POA: Diagnosis not present

## 2023-05-29 MED ORDER — METFORMIN HCL 500 MG PO TABS
ORAL_TABLET | ORAL | 3 refills | Status: DC
Start: 2023-05-29 — End: 2023-08-27

## 2023-05-29 NOTE — Patient Instructions (Signed)
Lab today.  Keep up the good work.  Follow up in 3 months.

## 2023-05-30 MED ORDER — TIRZEPATIDE 7.5 MG/0.5ML ~~LOC~~ SOAJ: 7.5000 mg | SUBCUTANEOUS | 0 refills | Status: DC

## 2023-05-30 NOTE — Assessment & Plan Note (Signed)
A1c obtained and was dramatically improved at 7.5.  Continuing his current dosing of insulin as well as metformin.  Will go ahead and increase Mounjaro.

## 2023-05-30 NOTE — Progress Notes (Signed)
Subjective:  Patient ID: Corey [name], male    DOB: [birth year]  Age: 57 y.o. MRN: [id number]  CC: Follow-up   HPI:  57 year old male with CAD, ascending aortic aneurysm, hypertension, hepatic steatosis, type 2 diabetes, CKD stage II with proteinuria, and hyperlipidemia presents for follow-up.  Patient has been doing much better.  His diet is improving.  He is glycemic control has dramatically improved following use of freestyle libre and addition of Mounjaro.  He has 90-day average blood sugar reading is 130.  Needs A1c today.  Previous A1c was 12.5.  Denies hypoglycemia.  Patient's hypertension is well-controlled on losartan and HCTZ.  Patient Active Problem List   Diagnosis Date Noted   CKD (chronic kidney disease) stage 2, GFR 60-89 ml/min 02/20/2023   Hepatic steatosis 11/08/2021   CAD (coronary artery disease) 10/11/2021   Ascending aortic aneurysm (HCC) 10/11/2021   Uncontrolled type 2 diabetes mellitus with hyperglycemia (HCC) 10/07/2021   Mixed hyperlipidemia 02/09/2013   Essential hypertension, benign 02/09/2013    Social Hx   Social History   Socioeconomic History   Marital status: Married    Spouse name: Not on file   Number of children: Not on file   Years of education: Not on file   Highest education level: Not on file  Occupational History   Not on file  Tobacco Use   Smoking status: Former    Current packs/day: 0.00    Average packs/day: 0.5 packs/day for 10.0 years (5.0 ttl pk-yrs)    Types: Cigarettes    Start date: 02/08/1983    Quit date: 02/07/1993    Years since quitting: 30.3   Smokeless tobacco: Never  Vaping Use   Vaping status: Never Used  Substance and Sexual Activity   Alcohol use: Yes    Comment: occasionally   Drug use: No   Sexual activity: Yes  Other Topics Concern   Not on file  Social History Narrative   Not on file   Social Determinants of Health   Financial Resource Strain: Not on file  Food Insecurity: Not on file   Transportation Needs: Not on file  Physical Activity: Not on file  Stress: Not on file  Social Connections: Unknown (05/24/2023)   Received from [facility]   Social Network    Social Network: Not on file    Review of Systems  Constitutional: Negative.   Respiratory: Negative.    Cardiovascular: Negative.     Objective:  BP 120/70   Pulse 95   Temp (!) 97.3 F (36.3 C)   Ht 5\' 7"  (1.702 m)   Wt 194 lb 12.8 oz (88.4 kg)   SpO2 98%   BMI 30.51 kg/m      05/29/2023    2:12 PM 03/21/2023    8:25 AM 02/20/2023    9:44 AM  BP/Weight  Systolic BP 120 130 126  Diastolic BP 70 70 78  Wt. (Lbs) 194.8 198 197  BMI 30.51 kg/m2 31.01 kg/m2 30.85 kg/m2    Physical Exam Vitals and nursing note reviewed.  Constitutional:      General: He is not in acute distress.    Appearance: Normal appearance.  HENT:     Head: Normocephalic and atraumatic.  Cardiovascular:     Rate and Rhythm: Normal rate and regular rhythm.  Pulmonary:     Effort: Pulmonary effort is normal.     Breath sounds: Normal breath sounds. No wheezing, rhonchi or rales.  Neurological:     Mental Status:  He is alert.  Psychiatric:        Mood and Affect: Mood normal.        Behavior: Behavior normal.     Lab Results  Component Value Date   WBC 4.4 02/15/2023   HGB 13.4 02/15/2023   HCT 40.2 02/15/2023   PLT 244 02/15/2023   GLUCOSE 304 (H) 02/15/2023   CHOL 174 02/15/2023   TRIG 700 (HH) 02/15/2023   HDL 22 (L) 02/15/2023   LDLCALC 50 02/15/2023   ALT 29 02/15/2023   AST 27 02/15/2023   NA 136 02/15/2023   K 4.7 02/15/2023   CL 100 02/15/2023   CREATININE 1.24 02/15/2023   BUN 20 02/15/2023   CO2 20 02/15/2023   TSH 1.239 10/05/2014   PSA 0.58 03/02/2014   INR 1.04 07/12/2017   HGBA1C 7.5 (H) 05/29/2023   MICROALBUR 10.6 (H) 10/05/2014     Assessment & Plan:   Problem List Items Addressed This Visit       Cardiovascular and Mediastinum   Essential hypertension, benign    At goal on  losartan/HCTZ.  Continue.        Endocrine   Uncontrolled type 2 diabetes mellitus with hyperglycemia (HCC) - Primary    A1c obtained and was dramatically improved at 7.5.  Continuing his current dosing of insulin as well as metformin.  Will go ahead and increase Mounjaro.      Relevant Medications   metFORMIN (GLUCOPHAGE) 500 MG tablet   tirzepatide (MOUNJARO) 7.5 MG/0.5ML Pen   Other Relevant Orders   Hemoglobin A1c (Completed)   Microalbumin / creatinine urine ratio (Completed)    Meds ordered this encounter  Medications   metFORMIN (GLUCOPHAGE) 500 MG tablet    Sig: TAKE 2 TABLETS BY MOUTH TWICE DAILY WITH A MEAL    Dispense:  360 tablet    Refill:  3   tirzepatide (MOUNJARO) 7.5 MG/0.5ML Pen    Sig: Inject 7.5 mg into the skin once a week.    Dispense:  6 mL    Refill:  0    Follow-up:  Return in about 3 months (around 08/29/2023) for Diabetes follow up.  Everlene Other DO [facility]

## 2023-05-30 NOTE — Assessment & Plan Note (Signed)
At goal on losartan/HCTZ.  Continue.

## 2023-06-05 ENCOUNTER — Encounter: Payer: Self-pay | Admitting: *Deleted

## 2023-06-22 ENCOUNTER — Other Ambulatory Visit (HOSPITAL_COMMUNITY): Payer: Managed Care, Other (non HMO)

## 2023-06-28 ENCOUNTER — Other Ambulatory Visit: Payer: Self-pay | Admitting: Family Medicine

## 2023-06-28 DIAGNOSIS — E782 Mixed hyperlipidemia: Secondary | ICD-10-CM

## 2023-07-04 ENCOUNTER — Telehealth: Payer: Self-pay

## 2023-07-04 NOTE — Telephone Encounter (Signed)
Patient dropped off document DMV, to be filled out by provider. Patient requested to send it back via Call Patient to pick up within ASAP. Document is located in providers tray at front office.Please advise at Mobile [phone] (mobile)

## 2023-07-17 ENCOUNTER — Other Ambulatory Visit: Payer: Self-pay | Admitting: Family Medicine

## 2023-07-23 ENCOUNTER — Other Ambulatory Visit: Payer: Self-pay | Admitting: Family Medicine

## 2023-07-24 ENCOUNTER — Other Ambulatory Visit: Payer: Self-pay | Admitting: Family Medicine

## 2023-08-02 ENCOUNTER — Encounter: Payer: Self-pay | Admitting: *Deleted

## 2023-08-21 ENCOUNTER — Other Ambulatory Visit: Payer: Self-pay

## 2023-08-21 ENCOUNTER — Other Ambulatory Visit: Payer: Self-pay | Admitting: Family Medicine

## 2023-08-21 DIAGNOSIS — E1165 Type 2 diabetes mellitus with hyperglycemia: Secondary | ICD-10-CM

## 2023-08-21 MED ORDER — FREESTYLE LIBRE 3 PLUS SENSOR MISC: 2 refills | Status: DC

## 2023-08-21 MED ORDER — FREESTYLE LIBRE 3 READER DEVI: 1.0000 | Freq: Every day | 0 refills | Status: DC

## 2023-08-21 MED ORDER — FREESTYLE LIBRE 3 PLUS SENSOR MISC
2 refills | Status: DC
Start: 2023-08-21 — End: 2023-10-01

## 2023-08-27 ENCOUNTER — Other Ambulatory Visit: Payer: Self-pay

## 2023-08-27 DIAGNOSIS — E1165 Type 2 diabetes mellitus with hyperglycemia: Secondary | ICD-10-CM

## 2023-08-27 DIAGNOSIS — E782 Mixed hyperlipidemia: Secondary | ICD-10-CM

## 2023-08-27 MED ORDER — METFORMIN HCL 500 MG PO TABS: ORAL_TABLET | ORAL | 3 refills | Status: DC

## 2023-08-27 MED ORDER — FENOFIBRATE 160 MG PO TABS: 160.0000 mg | ORAL_TABLET | Freq: Every day | ORAL | 1 refills | Status: DC

## 2023-08-27 MED ORDER — SEMGLEE (YFGN) 100 UNIT/ML ~~LOC~~ SOPN: 35.0000 [IU] | PEN_INJECTOR | Freq: Two times a day (BID) | SUBCUTANEOUS | 0 refills | Status: AC

## 2023-08-27 MED ORDER — TIRZEPATIDE 7.5 MG/0.5ML ~~LOC~~ SOAJ: 7.5000 mg | SUBCUTANEOUS | 0 refills | Status: DC

## 2023-08-27 MED ORDER — LOSARTAN POTASSIUM 100 MG PO TABS: 100.0000 mg | ORAL_TABLET | Freq: Every day | ORAL | 1 refills | Status: DC

## 2023-08-27 MED ORDER — ATORVASTATIN CALCIUM 20 MG PO TABS: 20.0000 mg | ORAL_TABLET | Freq: Every day | ORAL | 1 refills | Status: DC

## 2023-08-27 MED ORDER — HYDROCHLOROTHIAZIDE 25 MG PO TABS: 25.0000 mg | ORAL_TABLET | Freq: Every day | ORAL | 1 refills | Status: DC

## 2023-08-29 ENCOUNTER — Telehealth: Payer: Self-pay

## 2023-08-29 NOTE — Telephone Encounter (Signed)
Medication has been refilled on 08/27/2023.

## 2023-08-29 NOTE — Telephone Encounter (Signed)
Prescription Request  08/29/2023  LOV: Visit date not found  What is the name of the medication or equipment? SEMGLEE, YFGN, 100 UNIT/ML Pen metFORMIN (GLUCOPHAGE) 500 MG tablet   Have you contacted your pharmacy to request a refill? Yes   Which pharmacy would you like this sent to?   [facility]   Patient notified that their request is being sent to the clinical staff for review and that they should receive a response within 2 business days.   Please advise at Mobile [phone] (mobile)

## 2023-09-07 LAB — HM DIABETES EYE EXAM

## 2023-09-10 ENCOUNTER — Ambulatory Visit: Payer: Managed Care, Other (non HMO) | Admitting: Family Medicine

## 2023-09-10 VITALS — BP 125/79 | HR 100 | Temp 97.0°F | Ht 67.0 in | Wt 182.0 lb

## 2023-09-10 DIAGNOSIS — E1165 Type 2 diabetes mellitus with hyperglycemia: Secondary | ICD-10-CM | POA: Diagnosis not present

## 2023-09-10 DIAGNOSIS — E782 Mixed hyperlipidemia: Secondary | ICD-10-CM

## 2023-09-10 DIAGNOSIS — I10 Essential (primary) hypertension: Secondary | ICD-10-CM

## 2023-09-10 DIAGNOSIS — I7121 Aneurysm of the ascending aorta, without rupture: Secondary | ICD-10-CM

## 2023-09-10 NOTE — Assessment & Plan Note (Signed)
A1c today to assess.  If A1c is not at goal we will increase Mounjaro.

## 2023-09-10 NOTE — Assessment & Plan Note (Signed)
Reassessing today.  Continue fenofibrate and atorvastatin.

## 2023-09-10 NOTE — Progress Notes (Signed)
Subjective:  Patient ID: Corey [name], male    DOB: 1966/08/10  Age: 57 y.o. MRN: [id number]  CC:   Chief Complaint  Patient presents with   Diabetes   Hypertension    Done a CT scan of aorta thru [facility]    HPI:  57 year old male with ascending aortic aneurysm, CAD, hypertension, [place] steatosis, type 2 diabetes, hyperlipidemia presents for follow-up.  Patient does not have his CGM with him today.  He states overall his blood sugars are essentially unchanged from prior.  Most recent A1c 7.5.  Needs labs today.  He is compliant with Mounjaro, metformin, and Semglee.  Patient states that he is recently had some GI issues with gas and diarrhea.  They have now resolved.  Unsure if this is related to [facility].  Denies chest pain or shortness of breath.  He is feeling well.  Blood pressure well-controlled here today on losartan/HCTZ.  Needs reassessment of lipids.  Last lipid panel revealed good control of LDL but his triglycerides are markedly elevated.  At that time, A1c was markedly elevated as well.   Patient Active Problem List   Diagnosis Date Noted   CKD (chronic kidney disease) stage 2, GFR 60-89 ml/min 02/20/2023   Hepatic steatosis 11/08/2021   CAD (coronary artery disease) 10/11/2021   Ascending aortic aneurysm (HCC) 10/11/2021   Uncontrolled type 2 diabetes mellitus with hyperglycemia (HCC) 10/07/2021   Mixed hyperlipidemia 02/09/2013   Essential hypertension, benign 02/09/2013    Social Hx   Social History   Socioeconomic History   Marital status: Married    Spouse name: Not on file   Number of children: Not on file   Years of education: Not on file   Highest education level: Not on file  Occupational History   Not on file  Tobacco Use   Smoking status: Former    Current packs/day: 0.00    Average packs/day: 0.5 packs/day for 10.0 years (5.0 ttl pk-yrs)    Types: Cigarettes    Start date: 02/08/1983    Quit date: 02/07/1993    Years since quitting:  30.6   Smokeless tobacco: Never  Vaping Use   Vaping status: Never Used  Substance and Sexual Activity   Alcohol use: Yes    Comment: occasionally   Drug use: No   Sexual activity: Yes  Other Topics Concern   Not on file  Social History Narrative   Not on file   Social Determinants of Health   Financial Resource Strain: Not on file  Food Insecurity: Not on file  Transportation Needs: Not on file  Physical Activity: Not on file  Stress: Not on file  Social Connections: Unknown (05/24/2023)   Received from [employer]   Social Network    Social Network: Not on file    Review of Systems Per HPI  Objective:  BP 125/79   Pulse 100   Temp (!) 97 F (36.1 C)   Ht 5\' 7"  (1.702 m)   Wt 182 lb (82.6 kg)   SpO2 99%   BMI 28.51 kg/m      09/10/2023    8:56 AM 05/29/2023    2:12 PM 03/21/2023    8:25 AM  BP/Weight  Systolic BP 125 120 130  Diastolic BP 79 70 70  Wt. (Lbs) 182 194.8 198  BMI 28.51 kg/m2 30.51 kg/m2 31.01 kg/m2    Physical Exam Vitals and nursing note reviewed.  Constitutional:      General: He is not in  acute distress.    Appearance: Normal appearance.  HENT:     Head: Normocephalic and atraumatic.  Eyes:     General:        Right eye: No discharge.        Left eye: No discharge.     Conjunctiva/sclera: Conjunctivae normal.  Cardiovascular:     Rate and Rhythm: Normal rate and regular rhythm.  Pulmonary:     Effort: Pulmonary effort is normal.     Breath sounds: Normal breath sounds. No wheezing, rhonchi or rales.  Neurological:     Mental Status: He is alert.  Psychiatric:        Mood and Affect: Mood normal.        Behavior: Behavior normal.     Lab Results  Component Value Date   WBC 4.4 02/15/2023   HGB 13.4 02/15/2023   HCT 40.2 02/15/2023   PLT 244 02/15/2023   GLUCOSE 304 (H) 02/15/2023   CHOL 174 02/15/2023   TRIG 700 (HH) 02/15/2023   HDL 22 (L) 02/15/2023   LDLCALC 50 02/15/2023   ALT 29 02/15/2023   AST 27 02/15/2023    NA 136 02/15/2023   K 4.7 02/15/2023   CL 100 02/15/2023   CREATININE 1.24 02/15/2023   BUN 20 02/15/2023   CO2 20 02/15/2023   TSH 1.239 10/05/2014   PSA 0.58 03/02/2014   INR 1.04 07/12/2017   HGBA1C 7.5 (H) 05/29/2023   MICROALBUR 10.6 (H) 10/05/2014     Assessment & Plan:   Problem List Items Addressed This Visit       Cardiovascular and Mediastinum   Ascending aortic aneurysm (HCC)   Essential hypertension, benign    Stable.  Continue current medications.  Metabolic panel today.        Endocrine   Uncontrolled type 2 diabetes mellitus with hyperglycemia (HCC) - Primary    A1c today to assess.  If A1c is not at goal we will increase Mounjaro.      Relevant Orders   Microalbumin / creatinine urine ratio   Hemoglobin A1c   Basic metabolic panel     Other   Mixed hyperlipidemia    Reassessing today.  Continue fenofibrate and atorvastatin.      Relevant Orders   Lipid panel    Follow-up:  Return in about 3 months (around 12/11/2023).  Everlene Other DO [facility]

## 2023-09-10 NOTE — Patient Instructions (Signed)
Labs today.  Continue your current medications.  We will discuss dose changes once your A1c is back.  Follow-up in 3 months.

## 2023-09-10 NOTE — Assessment & Plan Note (Signed)
Stable.  Continue current medications.  Metabolic panel today.

## 2023-09-11 LAB — LIPID PANEL
Chol/HDL Ratio: 5.3 ratio — ABNORMAL HIGH (ref 0.0–5.0)
Cholesterol, Total: 142 mg/dL (ref 100–199)
HDL: 27 mg/dL — ABNORMAL LOW (ref >39)
LDL Chol Calc (NIH): 87 mg/dL (ref 0–99)
Triglycerides: 157 mg/dL — ABNORMAL HIGH (ref 0–149)
VLDL Cholesterol Cal: 28 mg/dL (ref 5–40)

## 2023-09-11 LAB — BASIC METABOLIC PANEL
BUN/Creatinine Ratio: 15 (ref 9–20)
BUN: 19 mg/dL (ref 6–24)
CO2: 22 mmol/L (ref 20–29)
Calcium: 11.1 mg/dL — ABNORMAL HIGH (ref 8.7–10.2)
Chloride: 105 mmol/L (ref 96–106)
Creatinine, Ser: 1.3 mg/dL — ABNORMAL HIGH (ref 0.76–1.27)
Glucose: 94 mg/dL (ref 70–99)
Potassium: 5 mmol/L (ref 3.5–5.2)
Sodium: 142 mmol/L (ref 134–144)
eGFR: 63 mL/min/{1.73_m2} (ref >59)

## 2023-09-11 LAB — HEMOGLOBIN A1C
Est. average glucose Bld gHb Est-mCnc: 154 mg/dL
Hgb A1c MFr Bld: 7 % — ABNORMAL HIGH (ref 4.8–5.6)

## 2023-09-11 LAB — MICROALBUMIN / CREATININE URINE RATIO
Creatinine, Urine: 87.2 mg/dL
Microalb/Creat Ratio: 52 mg/g{creat} — ABNORMAL HIGH (ref 0–29)
Microalbumin, Urine: 45.1 ug/mL

## 2023-09-13 ENCOUNTER — Telehealth: Payer: Self-pay

## 2023-09-13 ENCOUNTER — Other Ambulatory Visit: Payer: Self-pay

## 2023-09-13 NOTE — Telephone Encounter (Signed)
Unable to reach patient by phone, called to inform per the following information  A1c improved. Calcium remains elevated. Needs additional labs for work up - PTH, Ionized calcium These labs have been ordered

## 2023-09-14 NOTE — Telephone Encounter (Signed)
Copied from CRM [id number]. Topic: General - Other >> Sep 14, 2023  1:27 PM Lorin Glass B wrote: Reason for CRM: Trey Paula from [employer] wants a call regarding prior auth for [facility] 3 plus sensor. Callback info # [phone] option 2

## 2023-09-18 NOTE — Telephone Encounter (Signed)
Spoke with representative at  [place] [facility] at number provided and they stated they have no record of PA in process or anything needed for this patient.

## 2023-09-18 NOTE — Telephone Encounter (Signed)
Copied from CRM [id number]. Topic: General - Other >> Sep 17, 2023  5:19 PM Alvino Blood C wrote: Reason for CRM: Byrd Hesselbach from [employer] wants a call back regarding prior auth for [employer] 3 plus sensor. Caller states the issue is urgent. Callback info # [phone] option 2

## 2023-09-18 NOTE — Telephone Encounter (Addendum)
Spoke with representative at  [place] [facility] at number provided and they stated they have no record of PA in process or anything needed for this patient.

## 2023-09-26 ENCOUNTER — Encounter: Payer: Self-pay | Admitting: Family Medicine

## 2023-09-26 DIAGNOSIS — E1165 Type 2 diabetes mellitus with hyperglycemia: Secondary | ICD-10-CM

## 2023-09-27 NOTE — Telephone Encounter (Signed)
Tommie Sams, DO     Please order. Thank you

## 2023-10-01 MED ORDER — FREESTYLE LIBRE 3 PLUS SENSOR MISC: 0 refills | Status: DC

## 2023-10-03 ENCOUNTER — Other Ambulatory Visit: Payer: Self-pay

## 2023-10-22 ENCOUNTER — Encounter: Payer: Self-pay | Admitting: Family Medicine

## 2023-10-23 ENCOUNTER — Other Ambulatory Visit: Payer: Self-pay

## 2023-10-23 DIAGNOSIS — I2510 Atherosclerotic heart disease of native coronary artery without angina pectoris: Secondary | ICD-10-CM

## 2023-10-25 ENCOUNTER — Other Ambulatory Visit (HOSPITAL_COMMUNITY): Payer: Self-pay

## 2023-10-25 ENCOUNTER — Other Ambulatory Visit: Payer: Self-pay | Admitting: Family Medicine

## 2023-10-25 ENCOUNTER — Other Ambulatory Visit: Payer: Self-pay

## 2023-10-25 DIAGNOSIS — E1165 Type 2 diabetes mellitus with hyperglycemia: Secondary | ICD-10-CM

## 2023-10-25 MED ORDER — FREESTYLE LIBRE 3 PLUS SENSOR MISC
0 refills | Status: DC
  Filled 2023-10-25: qty 1, 14d supply, fill #0

## 2023-10-26 ENCOUNTER — Other Ambulatory Visit: Payer: Self-pay

## 2023-10-26 ENCOUNTER — Encounter: Payer: Self-pay | Admitting: Family Medicine

## 2023-10-29 ENCOUNTER — Other Ambulatory Visit: Payer: Self-pay

## 2023-10-29 ENCOUNTER — Other Ambulatory Visit: Payer: Self-pay | Admitting: Nurse Practitioner

## 2023-10-29 DIAGNOSIS — E1165 Type 2 diabetes mellitus with hyperglycemia: Secondary | ICD-10-CM

## 2023-10-29 MED ORDER — BD PEN NEEDLE MICRO U/F 32G X 6 MM MISC: 1 refills | Status: DC

## 2023-10-29 MED ORDER — FREESTYLE LIBRE 3 PLUS SENSOR MISC
3 refills | Status: DC
Start: 2023-10-29 — End: 2023-10-30

## 2023-10-29 NOTE — Telephone Encounter (Signed)
 Done

## 2023-10-30 ENCOUNTER — Other Ambulatory Visit: Payer: Self-pay | Admitting: Family Medicine

## 2023-10-30 DIAGNOSIS — E1165 Type 2 diabetes mellitus with hyperglycemia: Secondary | ICD-10-CM

## 2023-10-30 MED ORDER — FREESTYLE LIBRE 3 PLUS SENSOR MISC: 3 refills | Status: DC

## 2023-11-20 ENCOUNTER — Other Ambulatory Visit: Payer: Self-pay | Admitting: Family Medicine

## 2023-12-17 ENCOUNTER — Encounter: Payer: Self-pay | Admitting: Family Medicine

## 2023-12-17 ENCOUNTER — Ambulatory Visit: Payer: Managed Care, Other (non HMO) | Admitting: Family Medicine

## 2023-12-17 VITALS — BP 147/82 | HR 90 | Temp 96.8°F | Ht 67.0 in | Wt 187.0 lb

## 2023-12-17 DIAGNOSIS — I10 Essential (primary) hypertension: Secondary | ICD-10-CM

## 2023-12-17 DIAGNOSIS — Z7984 Long term (current) use of oral hypoglycemic drugs: Secondary | ICD-10-CM

## 2023-12-17 DIAGNOSIS — E782 Mixed hyperlipidemia: Secondary | ICD-10-CM | POA: Diagnosis not present

## 2023-12-17 DIAGNOSIS — E118 Type 2 diabetes mellitus with unspecified complications: Secondary | ICD-10-CM | POA: Diagnosis not present

## 2023-12-17 DIAGNOSIS — N182 Chronic kidney disease, stage 2 (mild): Secondary | ICD-10-CM

## 2023-12-17 MED ORDER — SEMGLEE (YFGN) 100 UNIT/ML ~~LOC~~ SOPN: 35.0000 [IU] | PEN_INJECTOR | Freq: Two times a day (BID) | SUBCUTANEOUS | Status: DC

## 2023-12-17 NOTE — Assessment & Plan Note (Signed)
 Appears to be at goal.  However, he is having significant hypoglycemia.  Awaiting on A1c prior to changing medication.  Continue Mounjaro, and metformin.  I anticipate that I will decrease the dose of his Semglee.

## 2023-12-17 NOTE — Assessment & Plan Note (Signed)
 Last DL was not at goal.  Lipid panel today.  Continue Lipitor and fenofibrate.

## 2023-12-17 NOTE — Assessment & Plan Note (Signed)
 On previous labs.  Needs further evaluation today with PTH.

## 2023-12-17 NOTE — Patient Instructions (Signed)
 Continue your medications. I will change insulin after reviewing your labs.  Labs today.  Follow up in 3 months.  Take care  Dr. Adriana Simas

## 2023-12-17 NOTE — Assessment & Plan Note (Signed)
 Will continue to monitor.  Continue current medications.  Labs today.

## 2023-12-17 NOTE — Progress Notes (Signed)
 Subjective:  Patient ID: Corey [name], male    DOB: Aug 12, 1966  Age: 58 y.o. MRN: [id number]  CC:   Chief Complaint  Patient presents with   Diabetes    HPI:  58 year old male with the below mentioned medical problems presents for follow-up.  Patient states that overall he is doing well.  He is feeling well.  Average blood sugar over the last 90 days = 131.  He has had several hypoglycemic episodes in the past 90 days.  Mostly occurring between midnight and 6 AM and noon to 6 PM.  He is compliant with Semglee 35 units twice daily, Mounjaro, and metformin.  Needs labs today.  BP mildly elevated here today.  He is compliant with losartan and HCTZ.  Denies chest pain or shortness of breath.  Patient Active Problem List   Diagnosis Date Noted   Type 2 diabetes mellitus with complications (HCC) 12/17/2023   Hypercalcemia 12/17/2023   CKD (chronic kidney disease) stage 2, GFR 60-89 ml/min 02/20/2023   Hepatic steatosis 11/08/2021   CAD (coronary artery disease) 10/11/2021   Ascending aortic aneurysm (HCC) 10/11/2021   Mixed hyperlipidemia 02/09/2013   Essential hypertension, benign 02/09/2013    Social Hx   Social History   Socioeconomic History   Marital status: Married    Spouse name: Not on file   Number of children: Not on file   Years of education: Not on file   Highest education level: Not on file  Occupational History   Not on file  Tobacco Use   Smoking status: Former    Current packs/day: 0.00    Average packs/day: 0.5 packs/day for 10.0 years (5.0 ttl pk-yrs)    Types: Cigarettes    Start date: 02/08/1983    Quit date: 02/07/1993    Years since quitting: 30.8   Smokeless tobacco: Never  Vaping Use   Vaping status: Never Used  Substance and Sexual Activity   Alcohol use: Yes    Comment: occasionally   Drug use: No   Sexual activity: Yes  Other Topics Concern   Not on file  Social History Narrative   Not on file   Social Drivers of Health   Financial  Resource Strain: Not on file  Food Insecurity: Not on file  Transportation Needs: Not on file  Physical Activity: Not on file  Stress: Not on file  Social Connections: Unknown (05/24/2023)   Received from [employer]   Social Network    Social Network: Not on file    Review of Systems Per HPI  Objective:  BP (!) 147/82   Pulse 90   Temp (!) 96.8 F (36 C)   Ht 5\' 7"  (1.702 m)   Wt 187 lb (84.8 kg)   SpO2 100%   BMI 29.29 kg/m      12/17/2023    9:15 AM 12/17/2023    8:45 AM 09/10/2023    8:56 AM  BP/Weight  Systolic BP 147 142 125  Diastolic BP 82 88 79  Wt. (Lbs)  187 182  BMI  29.29 kg/m2 28.51 kg/m2    Physical Exam Vitals and nursing note reviewed.  Constitutional:      General: He is not in acute distress.    Appearance: Normal appearance.  HENT:     Head: Normocephalic and atraumatic.  Eyes:     General:        Right eye: No discharge.        Left eye: No discharge.  Conjunctiva/sclera: Conjunctivae normal.  Cardiovascular:     Rate and Rhythm: Normal rate and regular rhythm.  Pulmonary:     Effort: Pulmonary effort is normal.     Breath sounds: Normal breath sounds. No wheezing, rhonchi or rales.  Neurological:     Mental Status: He is alert.  Psychiatric:        Mood and Affect: Mood normal.        Behavior: Behavior normal.     Lab Results  Component Value Date   WBC 4.4 02/15/2023   HGB 13.4 02/15/2023   HCT 40.2 02/15/2023   PLT 244 02/15/2023   GLUCOSE 94 09/10/2023   CHOL 142 09/10/2023   TRIG 157 (H) 09/10/2023   HDL 27 (L) 09/10/2023   LDLCALC 87 09/10/2023   ALT 29 02/15/2023   AST 27 02/15/2023   NA 142 09/10/2023   K 5.0 09/10/2023   CL 105 09/10/2023   CREATININE 1.30 (H) 09/10/2023   BUN 19 09/10/2023   CO2 22 09/10/2023   TSH 1.239 10/05/2014   PSA 0.58 03/02/2014   INR 1.04 07/12/2017   HGBA1C 7.0 (H) 09/10/2023   MICROALBUR 10.6 (H) 10/05/2014     Assessment & Plan:  Type 2 diabetes mellitus with  complications (HCC) Assessment & Plan: Appears to be at goal.  However, he is having significant hypoglycemia.  Awaiting on A1c prior to changing medication.  Continue Mounjaro, and metformin.  I anticipate that I will decrease the dose of his Semglee.    Orders: -     CMP14+EGFR -     Hemoglobin A1c -     Microalbumin / creatinine urine ratio  Mixed hyperlipidemia Assessment & Plan: Last DL was not at goal.  Lipid panel today.  Continue Lipitor and fenofibrate.  Orders: -     Lipid panel  CKD (chronic kidney disease) stage 2, GFR 60-89 ml/min -     CBC  Hypercalcemia Assessment & Plan: On previous labs.  Needs further evaluation today with PTH.  Orders: -     Parathyroid hormone, intact (no Ca)  Essential hypertension, benign Assessment & Plan: Will continue to monitor.  Continue current medications.  Labs today.   Other orders -     Semglee (yfgn); Inject 35 Units into the skin 2 (two) times daily.    Follow-up:  Return in about 3 months (around 03/15/2024).  Everlene Other DO [facility]

## 2023-12-18 LAB — CMP14+EGFR
ALT: 21 IU/L (ref 0–44)
AST: 24 IU/L (ref 0–40)
Albumin: 4.7 g/dL (ref 3.8–4.9)
Alkaline Phosphatase: 30 IU/L — ABNORMAL LOW (ref 44–121)
BUN/Creatinine Ratio: 11 (ref 9–20)
BUN: 15 mg/dL (ref 6–24)
Bilirubin Total: 0.4 mg/dL (ref 0.0–1.2)
CO2: 20 mmol/L (ref 20–29)
Calcium: 10.4 mg/dL — ABNORMAL HIGH (ref 8.7–10.2)
Chloride: 105 mmol/L (ref 96–106)
Creatinine, Ser: 1.36 mg/dL — ABNORMAL HIGH (ref 0.76–1.27)
Globulin, Total: 3.4 g/dL (ref 1.5–4.5)
Glucose: 79 mg/dL (ref 70–99)
Potassium: 5.2 mmol/L (ref 3.5–5.2)
Sodium: 139 mmol/L (ref 134–144)
Total Protein: 8.1 g/dL (ref 6.0–8.5)
eGFR: 60 mL/min/{1.73_m2} (ref >59)

## 2023-12-18 LAB — MICROALBUMIN / CREATININE URINE RATIO
Creatinine, Urine: 81.2 mg/dL
Microalb/Creat Ratio: 177 mg/g{creat} — ABNORMAL HIGH (ref 0–29)
Microalbumin, Urine: 143.4 ug/mL

## 2023-12-18 LAB — HEMOGLOBIN A1C
Est. average glucose Bld gHb Est-mCnc: 151 mg/dL
Hgb A1c MFr Bld: 6.9 % — ABNORMAL HIGH (ref 4.8–5.6)

## 2023-12-18 LAB — CBC
Hematocrit: 40 % (ref 37.5–51.0)
Hemoglobin: 12.9 g/dL — ABNORMAL LOW (ref 13.0–17.7)
MCH: 27.4 pg (ref 26.6–33.0)
MCHC: 32.3 g/dL (ref 31.5–35.7)
MCV: 85 fL (ref 79–97)
Platelets: 248 10*3/uL (ref 150–450)
RBC: 4.71 x10E6/uL (ref 4.14–5.80)
RDW: 14.4 % (ref 11.6–15.4)
WBC: 3.9 10*3/uL (ref 3.4–10.8)

## 2023-12-18 LAB — LIPID PANEL
Chol/HDL Ratio: 6 ratio — ABNORMAL HIGH (ref 0.0–5.0)
Cholesterol, Total: 155 mg/dL (ref 100–199)
HDL: 26 mg/dL — ABNORMAL LOW (ref >39)
LDL Chol Calc (NIH): 104 mg/dL — ABNORMAL HIGH (ref 0–99)
Triglycerides: 141 mg/dL (ref 0–149)
VLDL Cholesterol Cal: 25 mg/dL (ref 5–40)

## 2023-12-18 LAB — PARATHYROID HORMONE, INTACT (NO CA): PTH: 20 pg/mL (ref 15–65)

## 2023-12-23 ENCOUNTER — Encounter: Payer: Self-pay | Admitting: Family Medicine

## 2024-01-08 ENCOUNTER — Encounter: Payer: Self-pay | Admitting: Internal Medicine

## 2024-01-08 ENCOUNTER — Ambulatory Visit: Payer: Managed Care, Other (non HMO) | Attending: Internal Medicine | Admitting: Internal Medicine

## 2024-01-08 VITALS — BP 128/80 | HR 97 | Ht 67.0 in | Wt 192.8 lb

## 2024-01-08 DIAGNOSIS — Z0289 Encounter for other administrative examinations: Secondary | ICD-10-CM

## 2024-01-08 DIAGNOSIS — I10 Essential (primary) hypertension: Secondary | ICD-10-CM | POA: Diagnosis not present

## 2024-01-08 NOTE — Patient Instructions (Signed)
 Medication Instructions:  Your physician recommends that you continue on your current medications as directed. Please refer to the Current Medication list given to you today.   Labwork: None  Testing/Procedures: None  Follow-Up: Your physician recommends that you schedule a follow-up appointment in: As needed  Any Other Special Instructions Will Be Listed Below (If Applicable). Thank you for choosing [facility]!      If you need a refill on your cardiac medications before your next appointment, please call your pharmacy.

## 2024-01-08 NOTE — Progress Notes (Signed)
 Cardiology Office Note  Date: 01/08/2024   ID: Corey [name], DOB Jun 27, 1966, MRN [id number]  PCP:  Tommie Sams, DO  Cardiologist:  Marjo Bicker, MD Electrophysiologist:  None   History of Present Illness: Corey [name] is a 58 y.o. male known to have mild nonobstructive CAD, HTN, DM 2 was referred to cardiology clinic for employment physical.  LHC in 2018 showed mild nonobstructive CAD.  Echocardiogram in 2018 showed normal LVEF and no valvular heart disease.  He does not have any symptoms of angina, DOE in the last 1 year.  No symptoms of dizziness, presyncope, syncope, palpitations or leg swelling in the last 1 year either.  He does have imaging evidence of thoracic aortic aneurysm 40 mm which has been stable since 2019.  Past Medical History:  Diagnosis Date   Essential hypertension    Hypertriglyceridemia    Type 2 diabetes mellitus (HCC)     Past Surgical History:  Procedure Laterality Date   COLONOSCOPY N/A 10/14/2014   Procedure: COLONOSCOPY;  Surgeon: Corbin Ade, MD;  Location: [facility];  Service: Endoscopy;  Laterality: N/A;  8:30 AM   LEFT HEART CATH AND CORONARY ANGIOGRAPHY N/A 07/13/2017   Procedure: LEFT HEART CATH AND CORONARY ANGIOGRAPHY;  Surgeon: Kathleene Hazel, MD;  Location: [facility] INVASIVE CV LAB;  Service: Cardiovascular;  Laterality: N/A;   NO PAST SURGERIES      Current Outpatient Medications  Medication Sig Dispense Refill   Alcohol Swabs (ALCOHOL PADS) 70 % PADS Use  As directed with lantus, nightly. 100 each 2   aspirin 81 MG EC tablet Take 1 tablet (81 mg total) by mouth daily.     atorvastatin (LIPITOR) 20 MG tablet Take 1 tablet (20 mg total) by mouth daily. 90 tablet 1   blood glucose meter kit and supplies KIT Dispense based on patient and insurance preference. Test blood sugar once daily. Type 2 diabetes. E11.9 1 each 0   Continuous Glucose Sensor (FREESTYLE LIBRE 3 PLUS SENSOR) MISC Change sensor every 15 days. 6 each 3    fenofibrate 160 MG tablet Take 1 tablet (160 mg total) by mouth daily. 90 tablet 1   glucose blood (CONTOUR NEXT TEST) test strip USE 1 STRIP TO CHECK GLUCOSE ONCE DAILY 50 each 5   hydrochlorothiazide (HYDRODIURIL) 25 MG tablet Take 1 tablet (25 mg total) by mouth daily. 90 tablet 1   Insulin Pen Needle (BD PEN NEEDLE MICRO U/F) 32G X 6 MM MISC USE AS DIRECTED 100 each 1   losartan (COZAAR) 100 MG tablet Take 1 tablet (100 mg total) by mouth daily. 90 tablet 1   metFORMIN (GLUCOPHAGE) 500 MG tablet TAKE 2 TABLETS BY MOUTH TWICE DAILY WITH A MEAL 360 tablet 3   MOUNJARO 7.5 MG/0.5ML Pen INJECT 7.5 MG UNDER THE SKIN WEEKLY 6 mL 3   SEMGLEE, YFGN, 100 UNIT/ML Pen Inject 35 Units into the skin 2 (two) times daily.     No current facility-administered medications for this visit.   Allergies:  Patient has no known allergies.   Social History: The patient  reports that he quit smoking about 30 years ago. His smoking use included cigarettes. He started smoking about 40 years ago. He has a 5 pack-year smoking history. He has never used smokeless tobacco. He reports current alcohol use. He reports that he does not use drugs.   Family History: The patient's family history includes Cancer in his father; Colon cancer in his maternal uncle and  another family member; Diabetes in his father; Heart attack in his father; Hypertension in his father.   ROS:  Please see the history of present illness. Otherwise, complete review of systems is positive for none  All other systems are reviewed and negative.   Physical Exam: VS:  BP 128/80   Pulse 97   Ht 5\' 7"  (1.702 m)   Wt 192 lb 12.8 oz (87.5 kg)   SpO2 97%   BMI 30.20 kg/m , BMI Body mass index is 30.2 kg/m.  Wt Readings from Last 3 Encounters:  01/08/24 192 lb 12.8 oz (87.5 kg)  12/17/23 187 lb (84.8 kg)  09/10/23 182 lb (82.6 kg)    General: Patient appears comfortable at rest. HEENT: Conjunctiva and lids normal, oropharynx clear with moist  mucosa. Neck: Supple, no elevated JVP or carotid bruits, no thyromegaly. Lungs: Clear to auscultation, nonlabored breathing at rest. Cardiac: Regular rate and rhythm, no S3 or significant systolic murmur, no pericardial rub. Abdomen: Soft, nontender, no hepatomegaly, bowel sounds present, no guarding or rebound. Extremities: No pitting edema, distal pulses 2+. Skin: Warm and dry. Musculoskeletal: No kyphosis. Neuropsychiatric: Alert and oriented x3, affect grossly appropriate.  Recent Labwork: 12/17/2023: ALT 21; AST 24; BUN 15; Creatinine, Ser 1.36; Hemoglobin 12.9; Platelets 248; Potassium 5.2; Sodium 139     Component Value Date/Time   CHOL 155 12/17/2023 0932   TRIG 141 12/17/2023 0932   HDL 26 (L) 12/17/2023 0932   CHOLHDL 6.0 (H) 12/17/2023 0932   CHOLHDL 5.8 10/05/2014 0945   VLDL 44 (H) 10/05/2014 0945   LDLCALC 104 (H) 12/17/2023 0932     Assessment and Plan:  Employment physical: EKG today showed NSR, RBBB.  No evidence of ischemia.  He does not have any symptoms of angina, DOE, dizziness, presyncope, syncope or leg swelling.  LHC from 2018 showed mild nonobstructive CAD.  Echocardiogram from 2018 showed normal LVEF and no evidence of valvular heart disease.  He does have thoracic aortic aneurysm measuring 40 mm which has been stable since 2019.  Last imaging performed was in 2024.  He does not have any cardiac restrictions to resume driving for his work purposes.  Thoracic aortic aneurysm 40 mm: He will need annual CTA chest/aorta.  Can be done by PCP.  Goal BP less than 130/80 mmHg.  HTN, controlled: Continue current antihypertensives,) 100 mg once daily, HCTZ 25 mg once daily.  HLD, at goal: Continue atorvastatin 20 mg nightly and fenofibrate 160 mg once daily.  Follows with PCP.  Goal LDL less than 100.   Will fax the note to [facility]     Medication Adjustments/Labs and Tests Ordered: Current medicines are reviewed at length with the patient today.   Concerns regarding medicines are outlined above.    Disposition:  Follow up prn  Signed Morgin Halls Verne Spurr, MD, 01/08/2024 4:37 PM    [facility] [address] [place], [place], [place] [address]

## 2024-01-09 ENCOUNTER — Telehealth: Payer: Self-pay | Admitting: Internal Medicine

## 2024-01-09 NOTE — Telephone Encounter (Signed)
 Patient notified that notes have been faxed to fax number he provided at office visit. Pt had no further questions or concerns at this time.

## 2024-01-09 NOTE — Telephone Encounter (Signed)
 Pt called in stating some notes were supposed to be faxed over to Dr. Pernell Dupre office but they have not received yet. Please advise   Fax: [phone]

## 2024-01-26 ENCOUNTER — Other Ambulatory Visit: Payer: Self-pay | Admitting: Family Medicine

## 2024-01-26 ENCOUNTER — Other Ambulatory Visit: Payer: Self-pay | Admitting: Nurse Practitioner

## 2024-01-28 ENCOUNTER — Other Ambulatory Visit: Payer: Self-pay

## 2024-01-28 MED ORDER — SEMGLEE (YFGN) 100 UNIT/ML ~~LOC~~ SOPN: 35.0000 [IU] | PEN_INJECTOR | Freq: Two times a day (BID) | SUBCUTANEOUS | Status: DC

## 2024-02-06 ENCOUNTER — Encounter: Payer: Self-pay | Admitting: Family Medicine

## 2024-02-06 ENCOUNTER — Other Ambulatory Visit: Payer: Self-pay | Admitting: Family Medicine

## 2024-02-06 ENCOUNTER — Other Ambulatory Visit: Payer: Self-pay

## 2024-02-06 NOTE — Telephone Encounter (Signed)
 Please verify semglee if applicable

## 2024-02-07 ENCOUNTER — Other Ambulatory Visit: Payer: Self-pay

## 2024-02-07 MED ORDER — SEMGLEE (YFGN) 100 UNIT/ML ~~LOC~~ SOPN: 35.0000 [IU] | PEN_INJECTOR | Freq: Two times a day (BID) | SUBCUTANEOUS | 0 refills | Status: DC

## 2024-03-03 ENCOUNTER — Other Ambulatory Visit: Payer: Self-pay | Admitting: Family Medicine

## 2024-03-03 DIAGNOSIS — E782 Mixed hyperlipidemia: Secondary | ICD-10-CM

## 2024-03-20 ENCOUNTER — Other Ambulatory Visit: Payer: Self-pay | Admitting: Family Medicine

## 2024-03-28 ENCOUNTER — Other Ambulatory Visit: Payer: Self-pay | Admitting: Family Medicine

## 2024-03-31 ENCOUNTER — Encounter (INDEPENDENT_AMBULATORY_CARE_PROVIDER_SITE_OTHER): Payer: Self-pay | Admitting: *Deleted

## 2024-03-31 ENCOUNTER — Ambulatory Visit: Payer: Managed Care, Other (non HMO) | Admitting: Family Medicine

## 2024-03-31 VITALS — BP 128/79 | HR 111 | Temp 97.0°F | Ht 67.0 in | Wt 185.0 lb

## 2024-03-31 DIAGNOSIS — I10 Essential (primary) hypertension: Secondary | ICD-10-CM

## 2024-03-31 DIAGNOSIS — Z125 Encounter for screening for malignant neoplasm of prostate: Secondary | ICD-10-CM

## 2024-03-31 DIAGNOSIS — E782 Mixed hyperlipidemia: Secondary | ICD-10-CM

## 2024-03-31 DIAGNOSIS — N182 Chronic kidney disease, stage 2 (mild): Secondary | ICD-10-CM | POA: Diagnosis not present

## 2024-03-31 DIAGNOSIS — E118 Type 2 diabetes mellitus with unspecified complications: Secondary | ICD-10-CM | POA: Diagnosis not present

## 2024-03-31 DIAGNOSIS — Z1211 Encounter for screening for malignant neoplasm of colon: Secondary | ICD-10-CM

## 2024-03-31 NOTE — Assessment & Plan Note (Signed)
 Last lipid panel was not at goal.  Continue statin.  Awaiting results.

## 2024-03-31 NOTE — Assessment & Plan Note (Signed)
 Has been stable.  Metabolic panel to assess.

## 2024-03-31 NOTE — Patient Instructions (Signed)
 Labs today.  Schedule your physical.  Referral placed for Colonoscopy.  Take care  Dr. Debrah Fan

## 2024-03-31 NOTE — Assessment & Plan Note (Signed)
 A1c today to assess.  Corey [name]  has been discontinued.  Awaiting A1c.  Continue metformin  and Mounjaro .

## 2024-03-31 NOTE — Progress Notes (Signed)
 Subjective:  Patient ID: Corey [name], male    DOB: [birth year]  Age: 58 y.o. MRN: [id number]  CC:   Chief Complaint  Patient presents with   Diabetes    F/u diabetes    HPI:  58 year old male with hypertension, type 2 diabetes, CAD, acing aortic aneurysm, hepatic steatosis, CKD, hyperlipidemia presents for follow-up.  HTN stable on Losartan  and hydrochlorothiazide .  Patient needs A1c.  Average blood glucose over the past 90 days -137.  He states that there was an issue with his insulin  and via his mail order pharmacy and he has not been taking his insulin .  He has discontinued it.  He is taking metformin  and Mounjaro .  No adverse side effects.  Patient needs reassessment of lipids.  Last lipid panel was not at goal.  He is compliant with Lipitor.  Denies chest pain or shortness of breath.  He has no other complaints or concerns at this time.  Patient Active Problem List   Diagnosis Date Noted   Type 2 diabetes mellitus with complications (HCC) 12/17/2023   Hypercalcemia 12/17/2023   CKD (chronic kidney disease) stage 2, GFR 60-89 ml/min 02/20/2023   Hepatic steatosis 11/08/2021   CAD (coronary artery disease) 10/11/2021   Ascending aortic aneurysm (HCC) 10/11/2021   Mixed hyperlipidemia 02/09/2013   Essential hypertension, benign 02/09/2013    Social Hx   Social History   Socioeconomic History   Marital status: Married    Spouse name: Not on file   Number of children: Not on file   Years of education: Not on file   Highest education level: Not on file  Occupational History   Not on file  Tobacco Use   Smoking status: Former    Current packs/day: 0.00    Average packs/day: 0.5 packs/day for 10.0 years (5.0 ttl pk-yrs)    Types: Cigarettes    Start date: 02/08/1983    Quit date: 02/07/1993    Years since quitting: 31.1   Smokeless tobacco: Never  Vaping Use   Vaping status: Never Used  Substance and Sexual Activity   Alcohol  use: Yes    Comment: occasionally    Drug use: No   Sexual activity: Yes  Other Topics Concern   Not on file  Social History Narrative   Not on file   Social Drivers of Health   Financial Resource Strain: Not on file  Food Insecurity: Not on file  Transportation Needs: Not on file  Physical Activity: Not on file  Stress: Not on file  Social Connections: Unknown (05/24/2023)   Received from [employer]   Social Network    Social Network: Not on file    Review of Systems Per HPI  Objective:  BP 128/79   Pulse (!) 111   Temp (!) 97 F (36.1 C)   Ht 5\' 7"  (1.702 m)   Wt 185 lb (83.9 kg)   SpO2 98%   BMI 28.98 kg/m      03/31/2024    8:00 AM 01/08/2024    3:30 PM 12/17/2023    9:15 AM  BP/Weight  Systolic BP 128 128 147  Diastolic BP 79 80 82  Wt. (Lbs) 185 192.8   BMI 28.98 kg/m2 30.2 kg/m2     Physical Exam Vitals and nursing note reviewed.  Constitutional:      General: He is not in acute distress.    Appearance: Normal appearance.  HENT:     Head: Normocephalic and atraumatic.  Eyes:  General:        Right eye: No discharge.        Left eye: No discharge.     Conjunctiva/sclera: Conjunctivae normal.  Cardiovascular:     Rate and Rhythm: Normal rate and regular rhythm.  Pulmonary:     Effort: Pulmonary effort is normal.     Breath sounds: Normal breath sounds. No wheezing, rhonchi or rales.  Feet:     Comments: Diabetic foot exam performed today.  See quality metrics section. Neurological:     Mental Status: He is alert.  Psychiatric:        Mood and Affect: Mood normal.        Behavior: Behavior normal.     Lab Results  Component Value Date   WBC 3.9 12/17/2023   HGB 12.9 (L) 12/17/2023   HCT 40.0 12/17/2023   PLT 248 12/17/2023   GLUCOSE 79 12/17/2023   CHOL 155 12/17/2023   TRIG 141 12/17/2023   HDL 26 (L) 12/17/2023   LDLCALC 104 (H) 12/17/2023   ALT 21 12/17/2023   AST 24 12/17/2023   NA 139 12/17/2023   K 5.2 12/17/2023   CL 105 12/17/2023   CREATININE 1.36 (H)  12/17/2023   BUN 15 12/17/2023   CO2 20 12/17/2023   TSH 1.239 10/05/2014   PSA 0.58 03/02/2014   INR 1.04 07/12/2017   HGBA1C 6.9 (H) 12/17/2023   MICROALBUR 10.6 (H) 10/05/2014     Assessment & Plan:  Essential hypertension, benign Assessment & Plan: Stable.  Continue current medications.   Type 2 diabetes mellitus with complications (HCC) Assessment & Plan: A1c today to assess.  Semglee  has been discontinued.  Awaiting A1c.  Continue metformin  and Mounjaro .  Orders: -     CMP14+EGFR -     Hemoglobin A1c -     Microalbumin / creatinine urine ratio  CKD (chronic kidney disease) stage 2, GFR 60-89 ml/min Assessment & Plan: Has been stable.  Metabolic panel to assess.  Orders: -     CBC  Mixed hyperlipidemia Assessment & Plan: Last lipid panel was not at goal.  Continue statin.  Awaiting results.  Orders: -     Lipid panel  Hypercalcemia -     Parathyroid  hormone, intact (no Ca)  Screening PSA (prostate specific antigen) -     PSA  Encounter for screening colonoscopy -     Ambulatory referral to Gastroenterology    Follow-up: Patient to schedule follow-up for physical  Kathleen Papa DO [facility]

## 2024-03-31 NOTE — Assessment & Plan Note (Signed)
 Stable.  Continue current medications.

## 2024-04-01 ENCOUNTER — Ambulatory Visit: Payer: Self-pay | Admitting: Family Medicine

## 2024-04-02 ENCOUNTER — Other Ambulatory Visit: Payer: Self-pay | Admitting: Family Medicine

## 2024-04-02 LAB — CMP14+EGFR
ALT: 24 IU/L (ref 0–44)
AST: 27 IU/L (ref 0–40)
Albumin: 5 g/dL — ABNORMAL HIGH (ref 3.8–4.9)
Alkaline Phosphatase: 27 IU/L — ABNORMAL LOW (ref 44–121)
BUN/Creatinine Ratio: 21 (ref 10–24)
BUN: 28 mg/dL — ABNORMAL HIGH (ref 8–27)
Bilirubin Total: 0.4 mg/dL (ref 0.0–1.2)
CO2: 20 mmol/L (ref 20–29)
Calcium: 11.2 mg/dL — ABNORMAL HIGH (ref 8.6–10.2)
Chloride: 105 mmol/L (ref 96–106)
Creatinine, Ser: 1.36 mg/dL — ABNORMAL HIGH (ref 0.76–1.27)
Globulin, Total: 3.2 g/dL (ref 1.5–4.5)
Glucose: 139 mg/dL — ABNORMAL HIGH (ref 70–99)
Potassium: 4.9 mmol/L (ref 3.5–5.2)
Sodium: 140 mmol/L (ref 134–144)
Total Protein: 8.2 g/dL (ref 6.0–8.5)
eGFR: 60 mL/min/{1.73_m2} (ref >59)

## 2024-04-02 LAB — LIPID PANEL
Chol/HDL Ratio: 6 ratio — ABNORMAL HIGH (ref 0.0–5.0)
Cholesterol, Total: 180 mg/dL (ref 100–199)
HDL: 30 mg/dL — ABNORMAL LOW (ref >39)
LDL Chol Calc (NIH): 117 mg/dL — ABNORMAL HIGH (ref 0–99)
Triglycerides: 183 mg/dL — ABNORMAL HIGH (ref 0–149)
VLDL Cholesterol Cal: 33 mg/dL (ref 5–40)

## 2024-04-02 LAB — CBC
Hematocrit: 36.3 % — ABNORMAL LOW (ref 37.5–51.0)
Hemoglobin: 11.7 g/dL — ABNORMAL LOW (ref 13.0–17.7)
MCH: 27.4 pg (ref 26.6–33.0)
MCHC: 32.2 g/dL (ref 31.5–35.7)
MCV: 85 fL (ref 79–97)
Platelets: 304 10*3/uL (ref 150–450)
RBC: 4.27 x10E6/uL (ref 4.14–5.80)
RDW: 14.7 % (ref 11.6–15.4)
WBC: 3.7 10*3/uL (ref 3.4–10.8)

## 2024-04-02 LAB — HEMOGLOBIN A1C
Est. average glucose Bld gHb Est-mCnc: 154 mg/dL
Hgb A1c MFr Bld: 7 % — ABNORMAL HIGH (ref 4.8–5.6)

## 2024-04-02 LAB — MICROALBUMIN / CREATININE URINE RATIO
Creatinine, Urine: 87.9 mg/dL
Microalb/Creat Ratio: 38 mg/g{creat} — ABNORMAL HIGH (ref 0–29)
Microalbumin, Urine: 33.5 ug/mL

## 2024-04-02 LAB — PSA: Prostate Specific Ag, Serum: 1.1 ng/mL (ref 0.0–4.0)

## 2024-04-02 LAB — PARATHYROID HORMONE, INTACT (NO CA): PTH: 14 pg/mL — ABNORMAL LOW (ref 15–65)

## 2024-04-14 ENCOUNTER — Other Ambulatory Visit: Payer: Self-pay

## 2024-04-16 LAB — VITAMIN D 1,25 DIHYDROXY

## 2024-04-23 LAB — PTH-RELATED PEPTIDE: PTH-related peptide: <2 pmol/L

## 2024-04-23 LAB — VITAMIN D 1,25 DIHYDROXY
Vitamin D2 1, 25 (OH)2: <10 pg/mL
Vitamin D3 1, 25 (OH)2: 29 pg/mL

## 2024-04-23 LAB — VITAMIN D 25 HYDROXY (VIT D DEFICIENCY, FRACTURES): Vit D, 25-Hydroxy: 21.3 ng/mL — ABNORMAL LOW (ref 30.0–100.0)

## 2024-04-24 ENCOUNTER — Encounter: Payer: Self-pay | Admitting: Internal Medicine

## 2024-04-24 ENCOUNTER — Ambulatory Visit: Payer: Self-pay | Admitting: Family Medicine

## 2024-04-28 ENCOUNTER — Other Ambulatory Visit: Payer: Self-pay

## 2024-05-26 ENCOUNTER — Other Ambulatory Visit (HOSPITAL_COMMUNITY): Payer: Self-pay

## 2024-05-26 ENCOUNTER — Telehealth: Payer: Self-pay | Admitting: Pharmacy Technician

## 2024-05-26 NOTE — Telephone Encounter (Signed)
 Pharmacy Patient Advocate Encounter   Received notification from CoverMyMeds that prior authorization for Mounjaro  7.5mg /ml auto-injectors is required/requested.   Insurance verification completed.   The patient is insured through [employer] .   Per test claim: PA required; PA submitted to above mentioned insurance via LATENT Key/confirmation #/EOC [id number] Status is pending

## 2024-05-28 ENCOUNTER — Other Ambulatory Visit (HOSPITAL_COMMUNITY): Payer: Self-pay

## 2024-05-28 NOTE — Telephone Encounter (Signed)
 Pharmacy Patient Advocate Encounter  Received notification from [employer] that Prior Authorization for Mounjaro  7.5MG /0.5ML auto-injectors  has been APPROVED from 05/26/2024 to 05/28/2025. Unable to obtain price due to refill too soon rejection, last fill date 05/08/2024 next available fill date09/24/2025.   PA #/Case ID/Reference #: [id number]

## 2024-06-09 ENCOUNTER — Encounter: Payer: Self-pay | Admitting: Family Medicine

## 2024-06-09 ENCOUNTER — Ambulatory Visit (INDEPENDENT_AMBULATORY_CARE_PROVIDER_SITE_OTHER): Admitting: Family Medicine

## 2024-06-09 VITALS — BP 131/73 | HR 102 | Temp 96.8°F | Ht 67.0 in | Wt 188.0 lb

## 2024-06-09 DIAGNOSIS — N182 Chronic kidney disease, stage 2 (mild): Secondary | ICD-10-CM

## 2024-06-09 DIAGNOSIS — Z0000 Encounter for general adult medical examination without abnormal findings: Secondary | ICD-10-CM

## 2024-06-09 DIAGNOSIS — E782 Mixed hyperlipidemia: Secondary | ICD-10-CM | POA: Diagnosis not present

## 2024-06-09 NOTE — Assessment & Plan Note (Signed)
 Doing well.  Preventative health care updated. Labs today.

## 2024-06-09 NOTE — Progress Notes (Signed)
 Subjective:  Patient ID: Corey [name], male    DOB: 1966-02-01  Age: 58 y.o. MRN: [id number]  CC:   Chief Complaint  Patient presents with   Annual Exam    HPI:  58 year old male presents for an annual exam.  Patient reports that he is doing very well at this time.  His blood pressure is well-controlled.  Type 2 diabetes has been well-controlled.  He is working out and feels like he is gaining muscle mass.  Last eye exam was in November.  States that he has had his tetanus vaccine as well as shingles vaccine.  He is also had pneumonia vaccine.  These have all been given at the pharmacy.  His preventive health care is up-to-date except for flu vaccine which is not yet available in our office.  Patient Active Problem List   Diagnosis Date Noted   Annual physical exam 06/09/2024   Type 2 diabetes mellitus with complications (HCC) 12/17/2023   Hypercalcemia 12/17/2023   CKD (chronic kidney disease) stage 2, GFR 60-89 ml/min 02/20/2023   Hepatic steatosis 11/08/2021   CAD (coronary artery disease) 10/11/2021   Ascending aortic aneurysm (HCC) 10/11/2021   Mixed hyperlipidemia 02/09/2013   Essential hypertension, benign 02/09/2013    Social Hx   Social History   Socioeconomic History   Marital status: Married    Spouse name: Not on file   Number of children: Not on file   Years of education: Not on file   Highest education level: Not on file  Occupational History   Not on file  Tobacco Use   Smoking status: Former    Current packs/day: 0.00    Average packs/day: 0.5 packs/day for 10.0 years (5.0 ttl pk-yrs)    Types: Cigarettes    Start date: 02/08/1983    Quit date: 02/07/1993    Years since quitting: 31.3   Smokeless tobacco: Never  Vaping Use   Vaping status: Never Used  Substance and Sexual Activity   Alcohol  use: Yes    Comment: occasionally   Drug use: No   Sexual activity: Yes  Other Topics Concern   Not on file  Social History Narrative   Not on file    Social Drivers of Health   Financial Resource Strain: Not on file  Food Insecurity: Not on file  Transportation Needs: Not on file  Physical Activity: Not on file  Stress: Not on file  Social Connections: Unknown (05/24/2023)   Received from [facility]   Social Network    Social Network: Not on file    Review of Systems  Respiratory: Negative.    Cardiovascular: Negative.    Objective:  BP 131/73   Pulse (!) 102   Temp (!) 96.8 F (36 C)   Ht 5' 7 (1.702 m)   Wt 188 lb (85.3 kg)   SpO2 98%   BMI 29.44 kg/m      06/09/2024    9:29 AM 03/31/2024    8:00 AM 01/08/2024    3:30 PM  BP/Weight  Systolic BP 131 128 128  Diastolic BP 73 79 80  Wt. (Lbs) 188 185 192.8  BMI 29.44 kg/m2 28.98 kg/m2 30.2 kg/m2    Physical Exam Vitals and nursing note reviewed.  Constitutional:      General: He is not in acute distress.    Appearance: Normal appearance.  HENT:     Head: Normocephalic and atraumatic.  Eyes:     General:  Right eye: No discharge.        Left eye: No discharge.     Conjunctiva/sclera: Conjunctivae normal.  Cardiovascular:     Rate and Rhythm: Normal rate and regular rhythm.  Pulmonary:     Effort: Pulmonary effort is normal.     Breath sounds: Normal breath sounds. No wheezing, rhonchi or rales.  Neurological:     Mental Status: He is alert.  Psychiatric:        Mood and Affect: Mood normal.        Behavior: Behavior normal.     Lab Results  Component Value Date   WBC 3.7 03/31/2024   HGB 11.7 (L) 03/31/2024   HCT 36.3 (L) 03/31/2024   PLT 304 03/31/2024   GLUCOSE 139 (H) 03/31/2024   CHOL 180 03/31/2024   TRIG 183 (H) 03/31/2024   HDL 30 (L) 03/31/2024   LDLCALC 117 (H) 03/31/2024   ALT 24 03/31/2024   AST 27 03/31/2024   NA 140 03/31/2024   K 4.9 03/31/2024   CL 105 03/31/2024   CREATININE 1.36 (H) 03/31/2024   BUN 28 (H) 03/31/2024   CO2 20 03/31/2024   TSH 1.239 10/05/2014   PSA 0.58 03/02/2014   INR 1.04 07/12/2017    HGBA1C 7.0 (H) 03/31/2024   MICROALBUR 10.6 (H) 10/05/2014     Assessment & Plan:  Annual physical exam Assessment & Plan: Doing well.  Preventative health care updated. Labs today.   CKD (chronic kidney disease) stage 2, GFR 60-89 ml/min -     CBC -     CMP14+EGFR  Mixed hyperlipidemia -     Lipid panel    Follow-up:  6 months  Joaopedro Eschbach Bluford DO [facility]

## 2024-06-09 NOTE — Patient Instructions (Signed)
 Labs at your convenience.  Follow up in 6 months.

## 2024-06-30 ENCOUNTER — Ambulatory Visit: Admitting: Family Medicine

## 2024-08-12 ENCOUNTER — Other Ambulatory Visit: Payer: Self-pay | Admitting: Family Medicine

## 2024-08-12 DIAGNOSIS — E1165 Type 2 diabetes mellitus with hyperglycemia: Secondary | ICD-10-CM

## 2024-08-14 ENCOUNTER — Telehealth: Payer: Self-pay | Admitting: Internal Medicine

## 2024-08-14 NOTE — Telephone Encounter (Signed)
 Pt came into [place] office and asked for Dr.Mallipeddi to fill out a form that is due tomorrow. I informed pt Mallipeddi was not here but will return to [place] Monday. He didn not want me to take the form and send to [place] for her he said he was sao tome and [place] take it himself.   I did inform pt that we do charge $29 for forms and the doctors have  7-14 Business day to fill out forms. Pt verbalized understanding.  He did not want to sign a release for dot but instead for himself. Pt signed release form and office notes were given to him.

## 2024-09-30 ENCOUNTER — Encounter (INDEPENDENT_AMBULATORY_CARE_PROVIDER_SITE_OTHER): Payer: Self-pay | Admitting: *Deleted

## 2024-10-02 ENCOUNTER — Encounter: Payer: Self-pay | Admitting: Family Medicine

## 2024-10-08 ENCOUNTER — Other Ambulatory Visit: Payer: Self-pay

## 2024-10-08 DIAGNOSIS — E782 Mixed hyperlipidemia: Secondary | ICD-10-CM

## 2024-10-08 DIAGNOSIS — E1165 Type 2 diabetes mellitus with hyperglycemia: Secondary | ICD-10-CM

## 2024-10-08 MED ORDER — MOUNJARO 7.5 MG/0.5ML ~~LOC~~ SOAJ: 7.5000 mg | SUBCUTANEOUS | 3 refills | Status: AC

## 2024-10-08 MED ORDER — LOSARTAN POTASSIUM 100 MG PO TABS: 100.0000 mg | ORAL_TABLET | Freq: Every day | ORAL | 3 refills | Status: AC

## 2024-10-08 MED ORDER — METFORMIN HCL 500 MG PO TABS: ORAL_TABLET | ORAL | 3 refills | Status: AC

## 2024-10-08 MED ORDER — ATORVASTATIN CALCIUM 20 MG PO TABS: 20.0000 mg | ORAL_TABLET | Freq: Every day | ORAL | 3 refills | Status: AC

## 2024-10-14 ENCOUNTER — Other Ambulatory Visit: Payer: Self-pay | Admitting: Family Medicine

## 2024-10-14 DIAGNOSIS — E1165 Type 2 diabetes mellitus with hyperglycemia: Secondary | ICD-10-CM

## 2024-11-23 ENCOUNTER — Other Ambulatory Visit: Payer: Self-pay | Admitting: Family Medicine

## 2024-11-23 DIAGNOSIS — E1165 Type 2 diabetes mellitus with hyperglycemia: Secondary | ICD-10-CM

## 2024-12-15 ENCOUNTER — Ambulatory Visit: Admitting: Family Medicine

## 2024-12-16 ENCOUNTER — Ambulatory Visit: Admitting: Family Medicine
